# Patient Record
Sex: Female | Born: 1970 | Race: White | Hispanic: No | Marital: Married | Smoking: Never smoker
Health system: Southern US, Community
[De-identification: ages and names within clinical notes are randomized; demographics above are authoritative.]

## PROBLEM LIST (undated history)

## (undated) DIAGNOSIS — M1990 Unspecified osteoarthritis, unspecified site: Secondary | ICD-10-CM

## (undated) DIAGNOSIS — E049 Nontoxic goiter, unspecified: Secondary | ICD-10-CM

## (undated) DIAGNOSIS — R011 Cardiac murmur, unspecified: Secondary | ICD-10-CM

## (undated) DIAGNOSIS — B019 Varicella without complication: Secondary | ICD-10-CM

## (undated) DIAGNOSIS — B059 Measles without complication: Secondary | ICD-10-CM

## (undated) DIAGNOSIS — K589 Irritable bowel syndrome without diarrhea: Secondary | ICD-10-CM

## (undated) HISTORY — PX: WISDOM TOOTH EXTRACTION: SHX21

## (undated) HISTORY — DX: Measles without complication: B05.9

## (undated) HISTORY — DX: Varicella without complication: B01.9

---

## 1997-06-12 ENCOUNTER — Emergency Department (HOSPITAL_COMMUNITY): Admission: EM | Admit: 1997-06-12 | Discharge: 1997-06-12 | Payer: Self-pay | Admitting: Emergency Medicine

## 1997-08-10 ENCOUNTER — Other Ambulatory Visit: Admission: RE | Admit: 1997-08-10 | Discharge: 1997-08-10 | Payer: Self-pay | Admitting: Obstetrics and Gynecology

## 1998-11-14 ENCOUNTER — Inpatient Hospital Stay (HOSPITAL_COMMUNITY): Admission: AD | Admit: 1998-11-14 | Discharge: 1998-11-14 | Payer: Self-pay | Admitting: Obstetrics and Gynecology

## 1999-01-06 ENCOUNTER — Inpatient Hospital Stay (HOSPITAL_COMMUNITY): Admission: AD | Admit: 1999-01-06 | Discharge: 1999-01-06 | Payer: Self-pay | Admitting: Obstetrics and Gynecology

## 1999-01-24 ENCOUNTER — Emergency Department (HOSPITAL_COMMUNITY): Admission: EM | Admit: 1999-01-24 | Discharge: 1999-01-24 | Payer: Self-pay | Admitting: Emergency Medicine

## 1999-01-24 ENCOUNTER — Encounter: Payer: Self-pay | Admitting: Emergency Medicine

## 1999-01-28 ENCOUNTER — Inpatient Hospital Stay (HOSPITAL_COMMUNITY): Admission: AD | Admit: 1999-01-28 | Discharge: 1999-01-31 | Payer: Self-pay | Admitting: Obstetrics and Gynecology

## 1999-02-01 ENCOUNTER — Encounter: Admission: RE | Admit: 1999-02-01 | Discharge: 1999-05-02 | Payer: Self-pay | Admitting: Obstetrics and Gynecology

## 1999-03-01 ENCOUNTER — Other Ambulatory Visit: Admission: RE | Admit: 1999-03-01 | Discharge: 1999-03-01 | Payer: Self-pay | Admitting: Obstetrics and Gynecology

## 1999-08-28 ENCOUNTER — Emergency Department (HOSPITAL_COMMUNITY): Admission: EM | Admit: 1999-08-28 | Discharge: 1999-08-28 | Payer: Self-pay | Admitting: Emergency Medicine

## 2001-09-01 ENCOUNTER — Encounter: Payer: Self-pay | Admitting: Emergency Medicine

## 2001-09-01 ENCOUNTER — Emergency Department (HOSPITAL_COMMUNITY): Admission: EM | Admit: 2001-09-01 | Discharge: 2001-09-01 | Payer: Self-pay | Admitting: Emergency Medicine

## 2001-09-05 ENCOUNTER — Encounter: Payer: Self-pay | Admitting: Gastroenterology

## 2001-09-05 ENCOUNTER — Ambulatory Visit (HOSPITAL_COMMUNITY): Admission: RE | Admit: 2001-09-05 | Discharge: 2001-09-05 | Payer: Self-pay | Admitting: Gastroenterology

## 2001-09-11 ENCOUNTER — Other Ambulatory Visit: Admission: RE | Admit: 2001-09-11 | Discharge: 2001-09-11 | Payer: Self-pay | Admitting: Obstetrics and Gynecology

## 2002-02-12 ENCOUNTER — Emergency Department (HOSPITAL_COMMUNITY): Admission: EM | Admit: 2002-02-12 | Discharge: 2002-02-12 | Payer: Self-pay | Admitting: Emergency Medicine

## 2003-03-04 ENCOUNTER — Other Ambulatory Visit: Admission: RE | Admit: 2003-03-04 | Discharge: 2003-03-04 | Payer: Self-pay | Admitting: Obstetrics and Gynecology

## 2003-06-10 ENCOUNTER — Ambulatory Visit (HOSPITAL_COMMUNITY): Admission: RE | Admit: 2003-06-10 | Discharge: 2003-06-10 | Payer: Self-pay | Admitting: Obstetrics and Gynecology

## 2003-09-18 ENCOUNTER — Inpatient Hospital Stay (HOSPITAL_COMMUNITY): Admission: AD | Admit: 2003-09-18 | Discharge: 2003-09-18 | Payer: Self-pay | Admitting: Obstetrics and Gynecology

## 2004-11-17 ENCOUNTER — Ambulatory Visit: Payer: Self-pay | Admitting: Family Medicine

## 2005-01-04 ENCOUNTER — Ambulatory Visit: Payer: Self-pay | Admitting: Family Medicine

## 2005-01-30 ENCOUNTER — Other Ambulatory Visit: Admission: RE | Admit: 2005-01-30 | Discharge: 2005-01-30 | Payer: Self-pay | Admitting: Obstetrics and Gynecology

## 2005-02-13 ENCOUNTER — Ambulatory Visit: Payer: Self-pay | Admitting: Family Medicine

## 2006-06-09 ENCOUNTER — Observation Stay (HOSPITAL_COMMUNITY): Admission: EM | Admit: 2006-06-09 | Discharge: 2006-06-10 | Payer: Self-pay | Admitting: Emergency Medicine

## 2006-06-29 ENCOUNTER — Inpatient Hospital Stay (HOSPITAL_COMMUNITY): Admission: EM | Admit: 2006-06-29 | Discharge: 2006-07-02 | Payer: Self-pay | Admitting: Emergency Medicine

## 2006-09-12 ENCOUNTER — Ambulatory Visit: Payer: Self-pay | Admitting: Family Medicine

## 2006-09-13 ENCOUNTER — Ambulatory Visit (HOSPITAL_BASED_OUTPATIENT_CLINIC_OR_DEPARTMENT_OTHER): Admission: RE | Admit: 2006-09-13 | Discharge: 2006-09-13 | Payer: Self-pay | Admitting: General Surgery

## 2007-09-12 ENCOUNTER — Ambulatory Visit: Payer: Self-pay | Admitting: Family Medicine

## 2007-09-12 DIAGNOSIS — J309 Allergic rhinitis, unspecified: Secondary | ICD-10-CM | POA: Insufficient documentation

## 2007-09-12 DIAGNOSIS — J45909 Unspecified asthma, uncomplicated: Secondary | ICD-10-CM | POA: Insufficient documentation

## 2007-09-20 ENCOUNTER — Ambulatory Visit: Payer: Self-pay | Admitting: Family Medicine

## 2007-09-20 DIAGNOSIS — L299 Pruritus, unspecified: Secondary | ICD-10-CM | POA: Insufficient documentation

## 2007-09-22 ENCOUNTER — Telehealth: Payer: Self-pay | Admitting: Internal Medicine

## 2009-03-24 ENCOUNTER — Emergency Department (HOSPITAL_COMMUNITY): Admission: EM | Admit: 2009-03-24 | Discharge: 2009-03-25 | Payer: Self-pay | Admitting: Emergency Medicine

## 2009-03-28 ENCOUNTER — Ambulatory Visit: Payer: Self-pay | Admitting: Family Medicine

## 2009-03-28 DIAGNOSIS — K589 Irritable bowel syndrome without diarrhea: Secondary | ICD-10-CM | POA: Insufficient documentation

## 2009-05-25 ENCOUNTER — Encounter: Payer: Self-pay | Admitting: Family Medicine

## 2010-03-28 NOTE — Letter (Signed)
Summary: Referral - not able to see patient  [facility]  [address] [place], [place] [address]   Phone: [phone]  Fax: [phone]    May 25, 2009    Kelle Darting, M.D. [address] [place], [place] [address]    Re:   Kendra Robbins DOB:  [birth year] MRN:   [id number]    Dear Dr. Tawanna Cooler:  Thank you for your kind referral of the above patient.  We have attempted to schedule the Office Visit but have not been able to schedule because:  ___ The patient was not available by phone and/or has not returned our calls.   X  The patient declined to schedule the procedure at this time.  We appreciate the referral and hope that we will have the opportunity to treat this patient in the future.    Sincerely,    [facility] [phone]

## 2010-03-28 NOTE — Assessment & Plan Note (Signed)
Summary: nausea/dizzy/body aches/diarrhea/cjr   Vital Signs:  Patient profile:   40 year old female Height:      61 inches Weight:      218 pounds BMI:     41.34 Temp:     99.2 degrees F oral BP sitting:   120 / 84  (left arm) Cuff size:   regular  Vitals Entered By: Kern Reap CMA Duncan Dull) (March 28, 2009 12:06 PM)  Reason for Visit nausea and diarrhea, light headed  History of Present Illness: Kendra Robbins is a delightful 40 year old, married female, nonsmoker, who comes in today with a complex history.  She states for the past two to 3, months.  She's had a left low back pain, along with crampy abdominal pain in 3 to 4 bowel movements loose per day.  She was told at one time the she might have IBS.  This was many years ago, but one of our GI folks.  She does not recall who she saw the time.  She's also complaining of neck pain and soreness in the muscles of her legs cramps all over.  She said 10 pounds of weight loss to 218.  LMP was 6 weeks ago.  Her husband had a vasectomy.  She recently because of the severe neck pain and back pain went to the emergency room despite having no vomiting, and diarrhea.  She was told she was dehydrated, which she was not her CBC, electrolytes, urine pregnancy test.  All were normal.  Nonfasting blood sugar was 122.  Last Pap two years ago, normal  Allergies: 1)  ! * Terazol 2)  ! * Tetanus 3)  ! * Dilaudid 4)  ! Flagyl 5)  ! Percocet 6)  ! Prednisone 7)  ! * Unasyn 8)  ! * Zosyn  Past History:  Past medical, surgical, family and social histories (including risk factors) reviewed for relevance to current acute and chronic problems.  Past Medical History: Reviewed history from 09/12/2007 and no changes required. Bronchitis Obesity Allergic rhinitis Asthma  Past Surgical History: Reviewed history from 10/30/2006 and no changes required. Colonoscopy Wisdom Teeth  Family History: Reviewed history from 10/30/2006 and no changes  required. Family History of Asthma  Social History: Reviewed history from 10/30/2006 and no changes required. Occupation: Married Never Smoked Alcohol use-no Drug use-no  Review of Systems      See HPI  Physical Exam  General:  Well-developed,well-nourished,in no acute distress; alert,appropriate and cooperative throughout examination Neck:  No deformities, masses, or tenderness noted. Lungs:  Normal respiratory effort, chest expands symmetrically. Lungs are clear to auscultation, no crackles or wheezes. Heart:  Normal rate and regular rhythm. S1 and S2 normal without gallop, murmur, click, rub or other extra sounds. Abdomen:  the abdomen is markedly enlarged.  There is some tenderness in left upper quadrant.  No rebound.  No masses Msk:  No deformity or scoliosis noted of thoracic or lumbar spine.   Pulses:  R and L carotid,radial,femoral,dorsalis pedis and posterior tibial pulses are full and equal bilaterally Extremities:  No clubbing, cyanosis, edema, or deformity noted with normal full range of motion of all joints.   Neurologic:  No cranial nerve deficits noted. Station and gait are normal. Plantar reflexes are down-going bilaterally. DTRs are symmetrical throughout. Sensory, motor and coordinative functions appear intact. Skin:  Intact without suspicious lesions or rashes Psych:  Cognition and judgment appear intact. Alert and cooperative with normal attention span and concentration. No apparent delusions, illusions, hallucinations   Impression &  Recommendations:  Problem # 1:  IBS (ICD-564.1) Assessment Deteriorated  Orders: Gastroenterology Referral (GI)  Complete Medication List: 1)  Prednisone 20 Mg Tabs (Prednisone) .... Uad  Patient Instructions: 1)  avoid all foods containing lactose. 2)  Take Motrin 600 mg twice a day with food for your back and leg pain. 3)  We will get to set up for a GI consult.  If that is nondiagnostic then the next step would be a  rheumatology consult.

## 2010-05-15 LAB — URINALYSIS, ROUTINE W REFLEX MICROSCOPIC
Bilirubin Urine: NEGATIVE
Glucose, UA: NEGATIVE mg/dL
Hgb urine dipstick: NEGATIVE
Ketones, ur: 15 mg/dL — AB
Protein, ur: NEGATIVE mg/dL
Urobilinogen, UA: 0.2 mg/dL (ref 0.0–1.0)

## 2010-05-15 LAB — CBC
HCT: 44.2 % (ref 36.0–46.0)
Hemoglobin: 15.3 g/dL — ABNORMAL HIGH (ref 12.0–15.0)
MCHC: 34.7 g/dL (ref 30.0–36.0)
MCV: 89 fL (ref 78.0–100.0)
Platelets: 294 10*3/uL (ref 150–400)
RBC: 4.96 MIL/uL (ref 3.87–5.11)
RDW: 13.6 % (ref 11.5–15.5)
WBC: 6.7 10*3/uL (ref 4.0–10.5)

## 2010-05-15 LAB — DIFFERENTIAL
Basophils Absolute: 0 10*3/uL (ref 0.0–0.1)
Basophils Relative: 0 % (ref 0–1)
Eosinophils Absolute: 0 10*3/uL (ref 0.0–0.7)
Eosinophils Relative: 0 % (ref 0–5)
Lymphocytes Relative: 9 % — ABNORMAL LOW (ref 12–46)
Lymphs Abs: 0.6 10*3/uL — ABNORMAL LOW (ref 0.7–4.0)
Monocytes Absolute: 0 10*3/uL — ABNORMAL LOW (ref 0.1–1.0)
Monocytes Relative: 1 % — ABNORMAL LOW (ref 3–12)
Neutro Abs: 6.1 10*3/uL (ref 1.7–7.7)
Neutrophils Relative %: 91 % — ABNORMAL HIGH (ref 43–77)

## 2010-05-15 LAB — POCT I-STAT, CHEM 8
Calcium, Ion: 1.19 mmol/L (ref 1.12–1.32)
Creatinine, Ser: 0.6 mg/dL (ref 0.4–1.2)
Glucose, Bld: 122 mg/dL — ABNORMAL HIGH (ref 70–99)
Hemoglobin: 15.6 g/dL — ABNORMAL HIGH (ref 12.0–15.0)
Sodium: 136 mEq/L (ref 135–145)
TCO2: 25 mmol/L (ref 0–100)

## 2010-05-15 LAB — PREGNANCY, URINE: Preg Test, Ur: NEGATIVE

## 2010-05-15 LAB — URINE MICROSCOPIC-ADD ON

## 2010-07-11 NOTE — Op Note (Signed)
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   POSTOPERATIVE DIAGNOSIS:  Fistula in ano.   PROCEDURE:  Repair of fistula in ano by placement of cutting seton (two  size 0-0 silk sutures).   SURGEON:  Adolph Pollack, M.D.   ANESTHESIA:  General.   INDICATIONS:  Ms. Avis is a 40 year old female who had a complex left  anorectal abscess that was drained.  It recurred requiring second  drainage and has not completely healed and continues to drain.  In the  office, I was able to detect a fistula in ano and now she presents for  the above procedure.   TECHNIQUE:  She was seen in the holding area, then brought to the  operating room, placed supine on the operating table and general  anesthetic was administered.  She was then placed in the lithotomy  position and the perianal area sterilely prepped and draped.  An  anoscope was placed.  I used a blunt probe and identified the open area  of the wound and in the left perianal area.  I passed a probe  submuscular and found the fistula in ano.  I did not feel that I could  do a fistulotomy and I felt I might sacrifice too much muscle and cause  her to be incontinent.  Subsequently, I took two size 0-0 silk sutures,  passed them through the tract and tied them down tightly in a cutting  seton type fashion.  I then anesthetized the area with Marcaine.  Hemostasis was adequate.  A bulky dressing was applied.  She was taken  to recovery room in satisfactory condition and there were no apparent  complications.      Adolph Pollack, M.D.  Electronically Signed     TJR/MEDQ  D:  09/13/2006   T:  09/14/2006  Job:  [id number]

## 2010-07-11 NOTE — Assessment & Plan Note (Signed)
[facility]                                 ON-CALL NOTE   NAME:NORRISNayla, Kendra Robbins                        MRN:          [id number]  DATE:09/20/2007                            DOB:          13-May-1970    TIME SEEN:  8:52 a.m.   PHONE NUMBER:  [phone].   PATIENT OF:  [name]. Tawanna Cooler, MD.   She had a question about medicines and a skin problem.  The patient  stated last Friday, Dr. Tawanna Cooler gave her some prednisone taper for  allergies and asthma.  She did well with it until she got down to the  point where she was supposed to take a half pill each day for 3 days, it  sounds like it was 20 mg.  She missed her half dose yesterday.  Now, she  has some redness of her skin in her face and neck.  She stated that it  feels like she had a sunburn, although she has not really been out in  the sun.  She denies hives.  She stated she is very itchy around her  neck.  She was taking Zyrtec.  She took an extra dose of Benadryl on top  of it.  It does not seem to be helping.  She denies fever, chills, or  sinus pain.  She stated her ears are ringing a little, and her sinuses  are congested, but that is normal for her.  She is having no difficulty  breathing, no wheezing, and overall is feeling better.  She does not  know what is going on and wants to know if she needs to restart her  prednisone at the half dose.  I have told her to come on in for  evaluation at the [facility] clinic, so I can take a look at her rash.  At  that point, she also added she has been around cats and that might be  the reason for her rash as well.  I still offer her an appointment, and  she is coming in to be evaluated this morning.     Marne A. Tower, MD  Electronically Signed    MAT/MedQ  DD: 09/20/2007  DT: 09/20/2007  Job #: [id number]

## 2010-07-14 NOTE — Op Note (Signed)
Kendra Robbins, Kendra Robbins               ACCOUNT NO.:  [id number]   MEDICAL RECORD NO.:  [id number]          PATIENT TYPE:  OBV   LOCATION:  1829                         FACILITY:  [facility]   PHYSICIAN:  Adolph Pollack, M.D.DATE OF BIRTH:  [birth year]   DATE OF PROCEDURE:  06/09/2006  DATE OF DISCHARGE:                               OPERATIVE REPORT   PREOPERATIVE DIAGNOSIS:  Left anorectal abscess.   POSTOPERATIVE DIAGNOSIS:  Left anorectal abscess.   PROCEDURE:  Complex incision and drainage of left anorectal abscess.   SURGEON:  Adolph Pollack, M.D.   ANESTHESIA:  2% lidocaine.   TECHNIQUE:  She was placed in a lithotomy position in the emergency  department bed.  The left perianal area was sterilely prepped.  The area  was anesthetized with 2% lidocaine.  I then made an elliptical incision  in the skin in the left anorectal region; and evacuated a large amount  of purulent material under pressure.  Cavity measured approximately 6  cm.  I then placed a 1/4-inch Penrose drain into the cavity and anchored  it to the skin with a 4-0 chromic suture.  A bulky dressing was applied.  She tolerated the procedure well.      Adolph Pollack, M.D.  Electronically Signed     TJR/MEDQ  D:  06/09/2006  T:  06/09/2006  Job:  [id number]

## 2010-07-14 NOTE — Op Note (Signed)
NAMEJAYLEAN, Kendra Robbins               ACCOUNT NO.:  [id number]   MEDICAL RECORD NO.:  [id number]          PATIENT TYPE:  INP   LOCATION:  5706                         FACILITY:  [facility]   PHYSICIAN:  Cherylynn Ridges, M.D.    DATE OF BIRTH:  April 23, 1970   DATE OF PROCEDURE:  06/29/2006  DATE OF DISCHARGE:                               OPERATIVE REPORT   PREOPERATIVE DIAGNOSIS:  Recurrent perirectal and perineal abscess on  the left side.   POSTOPERATIVE DIAGNOSIS:  Recurrent perirectal and perineal abscess on  the left side.   PROCEDURE:  Incision and drainage of recurrent perineal and perirectal  abscess on the left side.   SURGEON:  Marta Lamas. Lindie Spruce, M.D.   ANESTHESIA:  General with a laryngeal airway.   ESTIMATED BLOOD LOSS:  50 mL.   COMPLICATIONS:  None.   CONDITION:  Stable.   FINDINGS:  The patient had a large golf ball or larger sized abscess in  the perianal area extending deep into the gluteal fatty tissue and also  towards the vulva.  There were no foreign bodies noted.   INDICATIONS FOR OPERATION:  The patient is a 40 year old female who had  a previous I&D of a perirectal abscess about three weeks ago who comes  in now with recurrence.   OPERATION:  The patient was taken to the operating room and placed on  the table in supine position.  After an adequate laryngeal airway  anesthetic was administered, she was prepped and draped in the usual  sterile manner after being placed in lithotomy position.   We made an incision about 5.5 cm long and bluntly dissected down into a  scarred and purulent cavity.  Aerobic and anaerobic cultures were sent.  There was some bleeding from the cavity.  We broke up loculations both  superiorly, anteriorly, and posteriorly.  After irrigating the wound  with an entire bottle of saline solution, we packed the entire cavity  with 0.5-inch iodoform NuGauze.  This will be removed prior to  discharge.   Once it was packed, it was covered  with 4 x 4s and ABD, and the patient  was taken to the recovery room in stable condition.      Cherylynn Ridges, M.D.  Electronically Signed     JOW/MEDQ  D:  06/29/2006  T:  06/30/2006  Job:  [id number]

## 2010-07-14 NOTE — H&P (Signed)
NAMEHAGEN, TIDD               ACCOUNT NO.:  [id number]   MEDICAL RECORD NO.:  [id number]           PATIENT TYPE:   LOCATION:                                 FACILITY:   PHYSICIAN:  Adolph Pollack, M.D.    DATE OF BIRTH:   DATE OF ADMISSION:  DATE OF DISCHARGE:                              HISTORY & PHYSICAL   CHIEF COMPLAINT:  Pain and rectal area.   HISTORY OF PRESENT ILLNESS:  This is a 40 year old female who about a  week or so ago had a little bit of swelling in the perianal area that  she said burst like a blood blister.  However, she began to progress to  be developing increasing perianal pain and having swelling and presented  to the emergency department last night.  She was evaluated and actually  underwent a CT of the pelvis which showed a 6 cm perianal abscess.  At  this time, we were called to see her.  She was given some Dilaudid in  the emergency department.  After the second dose, she had significant  nausea and vomiting following this.   PAST MEDICAL HISTORY:  1. Irritable bowel syndrome.  2. Stress urinary incontinence.  3. childhood pneumonia.   PREVIOUS OPERATIONS:  Wisdom tooth extraction.   ALLERGIES:  TETANUS.   MEDICATIONS:  None chronically.   SOCIAL HISTORY:  He is married with two children.  No tobacco or alcohol  use.   FAMILY HISTORY:  Atrial fibrillation in mother.   REVIEW OF SYSTEMS:  CARDIOVASCULAR:  No hypertension, heart disease.  PULMONARY:  No asthma, tuberculosis.  GI: No peptic ulcer disease,  hepatitis.  GU: No kidney stones.  ENDOCRINE:  No diabetes or  hypercholesterolemia.  NEUROLOGIC:  No seizures.  HEMATOLOGIC:  No  bleeding disorder, blood clots.   PHYSICAL EXAMINATION:  GENERAL:  An obese female who appears ill.  VITAL SIGNS:  Temperature is 99.9, blood pressure 121/84, pulse 101.  NECK:  Supple without obvious masses.  RESPIRATORY:  Breath sounds equal and clear, respirations unlabored.  CARDIOVASCULAR:  Heart  demonstrates a slightly increased rate with a  regular rhythm.  ABDOMEN:  Soft, obese, nontender, nondistended.  No obvious mass.  GU/ANORECTAL:  There is a left anorectal fluctuance, induration and  erythema present.  Slightly tender to touch.  EXTREMITIES:  Good muscle tone and range of motion.  SKIN:  No jaundice.   LABORATORY DATA:  Notable for white blood cell count of 10,300 with a  left shift.   CT scan was reviewed.   IMPRESSION:  1. Anorectal abscess.  2. Persistent nausea and vomiting after intravenous Dilaudid.   PLAN:  Will perform an incision and drainage here in emergency  department.  I will then admit her for 23-hour observation.      Adolph Pollack, M.D.  Electronically Signed     TJR/MEDQ  D:  06/09/2006  T:  06/09/2006  Job:  [id number]

## 2010-12-11 LAB — POCT HEMOGLOBIN-HEMACUE
Hemoglobin: 13.7
Operator id: 128471

## 2011-06-07 ENCOUNTER — Emergency Department (HOSPITAL_COMMUNITY)
Admission: EM | Admit: 2011-06-07 | Discharge: 2011-06-07 | Disposition: A | Discharge status: Home / Self Care | Payer: Medicaid Other | Attending: Emergency Medicine | Admitting: Emergency Medicine

## 2011-06-07 ENCOUNTER — Emergency Department (HOSPITAL_COMMUNITY): Payer: Medicaid Other

## 2011-06-07 ENCOUNTER — Encounter (HOSPITAL_COMMUNITY): Payer: Self-pay | Admitting: Nurse Practitioner

## 2011-06-07 DIAGNOSIS — W010XXA Fall on same level from slipping, tripping and stumbling without subsequent striking against object, initial encounter: Secondary | ICD-10-CM | POA: Insufficient documentation

## 2011-06-07 DIAGNOSIS — M79609 Pain in unspecified limb: Secondary | ICD-10-CM | POA: Insufficient documentation

## 2011-06-07 DIAGNOSIS — S5010XA Contusion of unspecified forearm, initial encounter: Secondary | ICD-10-CM | POA: Insufficient documentation

## 2011-06-07 DIAGNOSIS — S5011XA Contusion of right forearm, initial encounter: Secondary | ICD-10-CM

## 2011-06-07 HISTORY — DX: Cardiac murmur, unspecified: R01.1

## 2011-06-07 HISTORY — DX: Irritable bowel syndrome, unspecified: K58.9

## 2011-06-07 HISTORY — DX: Nontoxic goiter, unspecified: E04.9

## 2011-06-07 HISTORY — DX: Unspecified osteoarthritis, unspecified site: M19.90

## 2011-06-07 NOTE — ED Notes (Signed)
Returned from xray

## 2011-06-07 NOTE — ED Notes (Signed)
Pt fell yesterday hitting her R arm, was seen at [facility] and ace wrap applied but refused xrays due to cost. Reports she woke this am with increased pain, bruising and swelling to RFA. CMS intact. Denies other injuries
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
## 2011-06-07 NOTE — Discharge Instructions (Signed)
Ice and elevate arm when possible.  Take out of sling at least twice a day and do gentle range of motion exercises with shoulder.  Take tylenol as needed for pain, up to 4000mg  a day.  Follow up with your primary care doctor if pain has not started to improve in 7 days.  You should return to the ER if your pain or swelling worsens.

## 2011-06-07 NOTE — ED Notes (Signed)
Ortho called for pt  

## 2011-06-07 NOTE — ED Notes (Signed)
Patient transported to X-ray 
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
## 2011-06-07 NOTE — Progress Notes (Signed)
Orthopedic Tech Progress Note Patient Details:  Kendra Robbins [birth year] [id number]  Other Ortho Devices Ortho Device Location: arm sling Ortho Device Interventions: Application   Cammer, Mickie Bail 06/07/2011, 3:17 PM

## 2011-06-09 NOTE — ED Provider Notes (Signed)
Medical screening examination/treatment/procedure(s) were performed by non-physician practitioner and as supervising physician I was immediately available for consultation/collaboration.    Adalie Mand L Wilferd Ritson, MD 06/09/11 1135 

## 2013-07-14 ENCOUNTER — Encounter (INDEPENDENT_AMBULATORY_CARE_PROVIDER_SITE_OTHER): Payer: Self-pay | Admitting: Surgery

## 2013-07-14 ENCOUNTER — Ambulatory Visit (INDEPENDENT_AMBULATORY_CARE_PROVIDER_SITE_OTHER): Payer: Medicaid Other | Admitting: Surgery

## 2013-07-14 VITALS — BP 124/80 | HR 75 | Temp 97.5°F | Ht 61.0 in | Wt 256.0 lb

## 2013-07-14 DIAGNOSIS — K6289 Other specified diseases of anus and rectum: Secondary | ICD-10-CM | POA: Insufficient documentation

## 2013-07-14 NOTE — Patient Instructions (Signed)
Take antibiotics for the next 48 hours.  If no better call office and you will need pelvic CT to evaluate proctalgia.

## 2013-07-15 NOTE — Progress Notes (Signed)
Patient ID: Kendra MatteMarsha L Robbins, female   DOB: 05-Jun-1970, 43 y.o.   MRN: [id number]  Chief Complaint  Patient presents with  . rectal abscess    HPI Kendra Robbins is a 43 y.o. female.  Pt sent at the request ofJeffrey Shawnie DapperA Todd, MD For 5 day hx of rectal pain.  Constant and nothing makes it worse or better.  No blood in stool or drainage.  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  No hx of Crohn's disease. No fever or chills.   HPI  Past Medical History  Diagnosis Date  . Irritable bowel syndrome   . Migraine   . Goiter   . Heart murmur   . Arthritis     History reviewed. No pertinent past surgical history.  History reviewed. No pertinent family history.  Social History History  Substance Use Topics  . Smoking status: Never Smoker   . Smokeless tobacco: Not on file  . Alcohol Use: No    Allergies  Allergen Reactions  . Hydromorphone Hcl Other (See Comments)    Reaction unknown  . Metronidazole Other (See Comments)    Reaction unknown  . Oxycodone-Acetaminophen Other (See Comments)    Reaction unknown  . Piperacillin Sod-Tazobactam So Other (See Comments)    Reaction unknown  . Prednisone Other (See Comments)    Reaction unknown  . Rapaflo [Silodosin] Other (See Comments)    Room spinning, nausea/vomiting, dizziness.  . Tetanus Toxoid Other (See Comments)    Reaction unknown    Current Outpatient Prescriptions  Medication Sig Dispense Refill  . meloxicam (MOBIC) 7.5 MG tablet Take 7.5 mg by mouth daily.      . solifenacin (VESICARE) 5 MG tablet Take 10 mg by mouth daily.       No current facility-administered medications for this visit.    Review of Systems Review of Systems  Constitutional: Negative for fever and fatigue.  HENT: Negative.   Respiratory: Negative.   Cardiovascular: Negative.   Gastrointestinal: Negative for abdominal pain.  Endocrine: Negative.   Musculoskeletal: Negative.   Skin: Negative.   Hematological: Negative.     Psychiatric/Behavioral: Negative.     Blood pressure 124/80, pulse 75, temperature 97.5 F (36.4 C), height 5\' 1"  (1.549 m), weight 256 lb (116.121 kg).  Physical Exam Physical Exam  Constitutional: She is oriented to person, place, and time. She appears well-developed and well-nourished.  Eyes: EOM are normal. Pupils are equal, round, and reactive to light.  Genitourinary:     Neurological: She is alert and oriented to person, place, and time.    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  History of fistula en ano.  Started on clindamycin due to multiple allergies by primary care.       Plan    If no better in 48 hours,  Recommend pelvic CT to further evaluate.  Exam does not reveal an abscess at this point.  Agree with ABX and to follow condition on that.  If all else negative,  Needs EUA. Discussed with patient and husband.        Saydee Zolman A. Joseph Johns 07/15/2013, 7:57 AM

## 2013-07-21 ENCOUNTER — Encounter (INDEPENDENT_AMBULATORY_CARE_PROVIDER_SITE_OTHER): Payer: Self-pay | Admitting: Surgery

## 2013-07-21 ENCOUNTER — Encounter (INDEPENDENT_AMBULATORY_CARE_PROVIDER_SITE_OTHER): Payer: Medicaid Other | Admitting: Surgery

## 2013-07-21 ENCOUNTER — Ambulatory Visit (INDEPENDENT_AMBULATORY_CARE_PROVIDER_SITE_OTHER): Payer: Medicaid Other | Admitting: Surgery

## 2013-07-21 VITALS — BP 122/76 | HR 97 | Temp 98.5°F | Ht 61.0 in | Wt 255.0 lb

## 2013-07-21 DIAGNOSIS — K6289 Other specified diseases of anus and rectum: Secondary | ICD-10-CM

## 2013-07-21 NOTE — Patient Instructions (Signed)
Will set up CT to better evaluate pelvic pain.

## 2013-07-21 NOTE — Progress Notes (Signed)
Subjective:     Patient ID: Kendra Robbins, female   DOB: September 19, 1970, 43 y.o.   MRN: [id number]  HPI  Patient returns for followup of her proctalgia. She has good days and bad days. Not much better or worse. She stopped her antibiotics. She was getting some mild diarrhea and has resolved. Review of Systems  Cardiovascular: Negative.   Gastrointestinal: Positive for diarrhea and rectal pain.       Objective:   Physical Exam  Constitutional: She is oriented to person, place, and time.  Genitourinary:     Musculoskeletal: Normal range of motion.  Neurological: She is alert and oriented to person, place, and time.  Skin: Skin is warm and dry.  Psychiatric: She has a normal mood and affect. Her behavior is normal. Judgment and thought content normal.       Assessment:     Proctalgia    Plan:     No evidence of infection. He has continued discomfort therefore recommend CT pelvis to exclude abscess. If negative, will refer to Dr. Maisie Fus for evaluation.

## 2013-07-24 ENCOUNTER — Ambulatory Visit
Admission: RE | Admit: 2013-07-24 | Discharge: 2013-07-24 | Disposition: A | Discharge status: Home / Self Care | Payer: Medicaid Other | Source: Ambulatory Visit | Attending: Surgery | Admitting: Surgery

## 2013-07-24 DIAGNOSIS — K6289 Other specified diseases of anus and rectum: Secondary | ICD-10-CM

## 2013-07-24 MED ORDER — IOHEXOL 300 MG/ML  SOLN
125.0000 mL | Freq: Once | INTRAMUSCULAR | Status: AC | PRN
  Administered 2013-07-24: 125 mL via INTRAVENOUS

## 2013-08-10 ENCOUNTER — Ambulatory Visit (INDEPENDENT_AMBULATORY_CARE_PROVIDER_SITE_OTHER): Payer: Medicaid Other | Admitting: General Surgery

## 2013-08-10 ENCOUNTER — Encounter (INDEPENDENT_AMBULATORY_CARE_PROVIDER_SITE_OTHER): Payer: Self-pay | Admitting: General Surgery

## 2013-08-10 VITALS — BP 130/80 | HR 95 | Temp 98.2°F | Resp 16 | Ht 61.0 in | Wt 259.6 lb

## 2013-08-10 DIAGNOSIS — K6289 Other specified diseases of anus and rectum: Secondary | ICD-10-CM

## 2013-08-10 NOTE — Patient Instructions (Signed)
Call the office if your problems persist or worsen.    Fiber Chart  You should 25-30g of fiber per day and drinking 8 glasses of water to help your bowels move regularly.  In the chart below you can look up how much fiber you are getting in an average day.  If you are not getting enough fiber, you should add a fiber supplement to your diet.  Examples of this include Metamucil, FiberCon and Citrucel.  These can be purchased at your local grocery store or pharmacy.      [email or web address]://www.[employer].edu/offices/health/nutritioncoach/AtoZ/handouts/Fiber.pdf

## 2013-08-10 NOTE — Progress Notes (Signed)
Chief Complaint  Patient presents with  . proctalgia    HISTORY: Kendra Robbins is a 43 y.o. female who presents to the office with anal pain.  Other symptoms include stinging pain occasionally.  This had been occurring for about a month.  she has tried a round of antibiotics in the past with no success.  Nothing makes the symptoms worse.   It is intermittent in nature.  her bowel habits are regular and her bowel movements are somewhat hard.  She does endorse some straining.  her fiber intake is minimal.  her last colonoscopy was over 10 yrs ago and normal per pt.   Past Medical History  Diagnosis Date  . Irritable bowel syndrome   . Migraine   . Goiter   . Heart murmur   . Arthritis      History reviewed. Abscess and fistulotomy performed.  H/o bladder sling surgery    Current Outpatient Prescriptions  Medication Sig Dispense Refill  . loratadine (CLARITIN) 5 MG chewable tablet Chew 5 mg by mouth daily.      . meloxicam (MOBIC) 7.5 MG tablet Take 7.5 mg by mouth daily.      . mometasone (NASONEX) 50 MCG/ACT nasal spray Place 2 sprays into the nose daily.      . solifenacin (VESICARE) 5 MG tablet Take 10 mg by mouth daily.       No current facility-administered medications for this visit.      Allergies  Allergen Reactions  . Hydromorphone Hcl Other (See Comments)    Reaction unknown  . Metronidazole Other (See Comments)    Reaction unknown  . Oxycodone-Acetaminophen Other (See Comments)    Reaction unknown  . Piperacillin Sod-Tazobactam So Other (See Comments)    Reaction unknown  . Prednisone Other (See Comments)    Reaction unknown  . Rapaflo [Silodosin] Other (See Comments)    Room spinning, nausea/vomiting, dizziness.  . Tetanus Toxoid Other (See Comments)    Reaction unknown      History reviewed. No pertinent family history.  History   Social History  . Marital Status: Married    Spouse Name: N/A    Number of Children: N/A  . Years of Education: N/A    Social History Main Topics  . Smoking status: Never Smoker   . Smokeless tobacco: None  . Alcohol Use: No  . Drug Use: No  . Sexual Activity: None   Other Topics Concern  . None   Social History Narrative  . None      REVIEW OF SYSTEMS - PERTINENT POSITIVES ONLY: Review of Systems - General ROS: negative for - chills, fever or weight loss Hematological and Lymphatic ROS: negative for - bleeding problems, blood clots or bruising Respiratory ROS: no cough, shortness of breath, or wheezing Cardiovascular ROS: no chest pain or dyspnea on exertion Gastrointestinal ROS: positive for - abdominal pain, blood in stools and constipation negative for - change in bowel habits, diarrhea or nausea/vomiting Genito-Urinary ROS: no dysuria, trouble voiding, or hematuria  EXAM: Filed Vitals:   08/10/13 1549  BP: 130/80  Pulse: 95  Temp: 98.2 F (36.8 C)  Resp: 16    General appearance: alert and cooperative Resp: clear to auscultation bilaterally Cardio: regular rate and rhythm GI: normal findings: soft, non-tender  Procedure: Anoscopy Surgeon: Maisie Fushomas Diagnosis: anal pain  Assistant: Felix AhmadiStaten After the risks and benefits were explained, verbal consent was obtained for above procedure  Anesthesia: none Findings: no fluctuant masses, no signs of fistula,  grade 1 internal hemorrhoids    ASSESSMENT AND PLAN: Kendra MatteMarsha L Robbins is a 43 y.o. female with anal pain.  On exam I see no signs of a recurrent fistula or abscess.  Her symptoms are very minimal at this time, so we have decided to watch this for now.  She will call the office if her symptoms persist or get worse.  I think if this occurs, we will order an MRI to look for an occult fistula.  I have recommended that she start a fiber supplement for her irregular bowel habits.      Vanita PandaAlicia C Gillie Crisci, MD Colon and Rectal Surgery / General Surgery [facility]      Visit Diagnoses: 1. Anal pain     Primary  Care Physician: Dema SeverinYORK,REGINA F, NP

## 2015-06-28 ENCOUNTER — Other Ambulatory Visit: Payer: Self-pay | Admitting: Physician Assistant

## 2015-06-28 DIAGNOSIS — IMO0001 Reserved for inherently not codable concepts without codable children: Secondary | ICD-10-CM

## 2015-06-28 DIAGNOSIS — H9313 Tinnitus, bilateral: Secondary | ICD-10-CM

## 2015-06-28 DIAGNOSIS — H918X1 Other specified hearing loss, right ear: Secondary | ICD-10-CM

## 2015-07-04 ENCOUNTER — Ambulatory Visit
Admission: RE | Admit: 2015-07-04 | Discharge: 2015-07-04 | Disposition: A | Discharge status: Home / Self Care | Payer: Medicaid Other | Source: Ambulatory Visit | Attending: Physician Assistant | Admitting: Physician Assistant

## 2015-07-04 DIAGNOSIS — H9313 Tinnitus, bilateral: Secondary | ICD-10-CM

## 2015-07-04 DIAGNOSIS — H918X1 Other specified hearing loss, right ear: Secondary | ICD-10-CM

## 2015-07-04 DIAGNOSIS — IMO0001 Reserved for inherently not codable concepts without codable children: Secondary | ICD-10-CM

## 2015-07-04 MED ORDER — GADOBENATE DIMEGLUMINE 529 MG/ML IV SOLN
20.0000 mL | Freq: Once | INTRAVENOUS | Status: AC | PRN
  Administered 2015-07-04: 20 mL via INTRAVENOUS

## 2016-03-19 ENCOUNTER — Encounter (HOSPITAL_COMMUNITY): Payer: Self-pay

## 2016-03-19 ENCOUNTER — Emergency Department (HOSPITAL_COMMUNITY): Payer: Medicaid Other

## 2016-03-19 ENCOUNTER — Emergency Department (HOSPITAL_COMMUNITY)
Admission: EM | Admit: 2016-03-19 | Discharge: 2016-03-20 | Disposition: A | Discharge status: Home / Self Care | Payer: Medicaid Other | Attending: Emergency Medicine | Admitting: Emergency Medicine

## 2016-03-19 DIAGNOSIS — Y999 Unspecified external cause status: Secondary | ICD-10-CM | POA: Insufficient documentation

## 2016-03-19 DIAGNOSIS — Y92410 Unspecified street and highway as the place of occurrence of the external cause: Secondary | ICD-10-CM | POA: Diagnosis not present

## 2016-03-19 DIAGNOSIS — Y939 Activity, unspecified: Secondary | ICD-10-CM | POA: Diagnosis not present

## 2016-03-19 DIAGNOSIS — M79644 Pain in right finger(s): Secondary | ICD-10-CM

## 2016-03-19 NOTE — ED Notes (Signed)
Patient transported to X-ray 
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
## 2016-03-20 MED ORDER — NAPROXEN 500 MG PO TABS: 500.0000 mg | ORAL_TABLET | Freq: Two times a day (BID) | ORAL | 0 refills | Status: DC

## 2016-03-20 MED ORDER — NAPROXEN 250 MG PO TABS
500.0000 mg | ORAL_TABLET | Freq: Once | ORAL | Status: AC
  Administered 2016-03-20: 500 mg via ORAL
  Filled 2016-03-20: qty 2
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
## 2016-03-20 NOTE — ED Notes (Signed)
Signature pad not working. 

## 2016-07-29 ENCOUNTER — Emergency Department (HOSPITAL_BASED_OUTPATIENT_CLINIC_OR_DEPARTMENT_OTHER)
Admit: 2016-07-29 | Discharge: 2016-07-29 | Disposition: A | Discharge status: Home / Self Care | Payer: Medicaid Other | Attending: Emergency Medicine | Admitting: Emergency Medicine

## 2016-07-29 ENCOUNTER — Emergency Department (HOSPITAL_COMMUNITY)
Admission: EM | Admit: 2016-07-29 | Discharge: 2016-07-29 | Disposition: A | Discharge status: Home / Self Care | Payer: Medicaid Other | Attending: Emergency Medicine | Admitting: Emergency Medicine

## 2016-07-29 ENCOUNTER — Encounter (HOSPITAL_COMMUNITY): Payer: Self-pay | Admitting: Emergency Medicine

## 2016-07-29 DIAGNOSIS — M79651 Pain in right thigh: Secondary | ICD-10-CM | POA: Insufficient documentation

## 2016-07-29 DIAGNOSIS — M79609 Pain in unspecified limb: Secondary | ICD-10-CM | POA: Diagnosis not present

## 2016-07-29 MED ORDER — IBUPROFEN 800 MG PO TABS: 800.0000 mg | ORAL_TABLET | Freq: Three times a day (TID) | ORAL | 0 refills | Status: AC | PRN

## 2016-07-29 NOTE — Progress Notes (Signed)
VASCULAR LAB PRELIMINARY  PRELIMINARY  PRELIMINARY  PRELIMINARY  Right lower extremity venous duplex completed.    Preliminary report:  There is no DVT or SVT noted in the right lower extremity.   Called report to [facility] Lawyer, PA  [facility], [facility]ANDACE, RVT 07/29/2016, 9:31 AM

## 2016-07-29 NOTE — ED Triage Notes (Signed)
Reports a knot on the outer right thigh that she noticed a few days.  Reports pain going from knot to inner thigh.  Could not visualize knott in triage.  Skin soft and pliable.

## 2016-07-29 NOTE — ED Provider Notes (Signed)
[facility] Provider Note   CSN: [id number] Arrival date & time: 07/29/16  0559     History   Chief Complaint Chief Complaint  Patient presents with  . reports knot on outer right thigh.    HPI MAYCE NOYES is a 46 y.o. female.  HPI Patient presents to the emergency department with a painful spot on the right outer thigh that she also states feels like has a knot.  The patient states that she noticed this area 2 days ago.  She states that she has been standing and walking a lot over the last week.  The patient states that she did not take any medications prior to arrival.  States nothing seems make the condition better, but palpation makes the pain worseThe patient denies chest pain, shortness of breath, headache,blurred vision, neck pain, fever, weakness, numbness, dizziness, , nausea, vomiting,  rash, back pain, dysuria, hematemesis, bloody stool, near syncope, or syncope Past Medical History:  Diagnosis Date  . Arthritis   . Goiter   . Heart murmur   . Irritable bowel syndrome   . Migraine     Patient Active Problem List   Diagnosis Date Noted  . 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 IBS 03/28/2009  . PRURITUS 09/20/2007  . ALLERGIC RHINITIS 09/12/2007  . ASTHMA 09/12/2007    History reviewed. No pertinent surgical history.  OB History    No data available       Home Medications    Prior to Admission medications   Medication Sig Start Date End Date Taking? Authorizing Provider  loratadine (CLARITIN) 5 MG chewable tablet Chew 5 mg by mouth daily.    [provider]  meloxicam (MOBIC) 7.5 MG tablet Take 7.5 mg by mouth daily.    [provider]  mometasone (NASONEX) 50 MCG/ACT nasal spray Place 2 sprays into the nose daily.    [provider]  naproxen (NAPROSYN) 500 MG tablet Take 1 tablet (500 mg total) by mouth 2 (two) times daily. 03/20/16   Audry Pili, PA-C  solifenacin (VESICARE) 5 MG tablet Take 10 mg by mouth daily.    [provider]    Family History No family history on file.  Social History Social History  Substance Use Topics  . Smoking status: Never Smoker  . Smokeless tobacco: Never Used  . Alcohol use No     Allergies   Hydromorphone hcl; Metronidazole; Oxycodone-acetaminophen; Piperacillin sod-tazobactam so; Prednisone; Rapaflo [silodosin]; and Tetanus toxoid   Review of Systems Review of Systems  All other systems negative except as documented in the HPI. All pertinent positives and negatives as reviewed in the HPI. Physical Exam Updated Vital Signs BP 140/79 (BP Location: Left Arm)   Pulse 84   Temp 97.6 F (36.4 C) (Oral)   Resp 16   Ht 5\' 1"  (1.549 m)   Wt 111.1 kg (245 lb)   SpO2 98%   BMI 46.29 kg/m   Physical Exam  Constitutional: She is oriented to person, place, and time. She appears well-developed and well-nourished. No distress.  HENT:  Head: Normocephalic and atraumatic.  Eyes: Pupils are equal, round, and reactive to light.  Pulmonary/Chest: Effort normal.  Musculoskeletal:       Legs: Neurological: She is alert and oriented to person, place, and time.  Skin: Skin is warm and dry.  Psychiatric: She has a normal mood and affect.  Nursing note and vitals reviewed.    ED Treatments / Results  Labs (all labs ordered are  listed, but only abnormal results are displayed) Labs Reviewed - No data to display  EKG  EKG Interpretation None       Radiology No results found.  Procedures Procedures (including critical care time)  Medications Ordered in ED Medications - No data to display   Initial Impression / Assessment and Plan / ED Course  I have reviewed the triage vital signs and the nursing notes.  Pertinent labs & imaging results that were available during my care of the patient were reviewed by me and considered in my medical decision making (see chart for details).     The patient has a negative DVT study.  There is also no superficial  veins that are showing any clots.  Patient be treated with anti-inflammatories, to return here as needed.  Patient agrees the plan and all questions were answered.  Told to use ice and heat over the area as well  Final Clinical Impressions(s) / ED Diagnoses   Final diagnoses:  None    New Prescriptions New Prescriptions   No medications on file     Charlestine NightLawyer, Hilja Kintzel, Cordelia Poche-C 07/29/16 [id number]    Loren RacerYelverton, David, MD 08/02/16 [phone]

## 2016-07-29 NOTE — Discharge Instructions (Signed)
Your ultrasound for blood clots was negative.  Follow-up with your primary doctor.  Use ice and heat over the area that is sore

## 2020-09-19 ENCOUNTER — Emergency Department (HOSPITAL_COMMUNITY): Payer: 59

## 2020-09-19 ENCOUNTER — Other Ambulatory Visit: Payer: Self-pay

## 2020-09-19 ENCOUNTER — Emergency Department (HOSPITAL_COMMUNITY)
Admission: EM | Admit: 2020-09-19 | Discharge: 2020-09-19 | Disposition: A | Discharge status: Home / Self Care | Payer: 59 | Attending: Emergency Medicine | Admitting: Emergency Medicine

## 2020-09-19 ENCOUNTER — Encounter (HOSPITAL_COMMUNITY): Payer: Self-pay | Admitting: Emergency Medicine

## 2020-09-19 DIAGNOSIS — R4781 Slurred speech: Secondary | ICD-10-CM

## 2020-09-19 DIAGNOSIS — Z79899 Other long term (current) drug therapy: Secondary | ICD-10-CM | POA: Insufficient documentation

## 2020-09-19 DIAGNOSIS — J45909 Unspecified asthma, uncomplicated: Secondary | ICD-10-CM | POA: Diagnosis not present

## 2020-09-19 DIAGNOSIS — G43109 Migraine with aura, not intractable, without status migrainosus: Secondary | ICD-10-CM

## 2020-09-19 DIAGNOSIS — I639 Cerebral infarction, unspecified: Secondary | ICD-10-CM | POA: Insufficient documentation

## 2020-09-19 LAB — DIFFERENTIAL
Abs Immature Granulocytes: 0.01 10*3/uL (ref 0.00–0.07)
Basophils Absolute: 0.1 10*3/uL (ref 0.0–0.1)
Basophils Relative: 1 %
Eosinophils Absolute: 0.1 10*3/uL (ref 0.0–0.5)
Eosinophils Relative: 2 %
Immature Granulocytes: 0 %
Lymphocytes Relative: 27 %
Lymphs Abs: 1.6 10*3/uL (ref 0.7–4.0)
Monocytes Absolute: 0.3 10*3/uL (ref 0.1–1.0)
Monocytes Relative: 6 %
Neutro Abs: 3.8 10*3/uL (ref 1.7–7.7)
Neutrophils Relative %: 64 %

## 2020-09-19 LAB — I-STAT CHEM 8, ED
BUN: 12 mg/dL (ref 6–20)
Calcium, Ion: 1.09 mmol/L — ABNORMAL LOW (ref 1.15–1.40)
Chloride: 105 mmol/L (ref 98–111)
Creatinine, Ser: 0.9 mg/dL (ref 0.44–1.00)
Glucose, Bld: 126 mg/dL — ABNORMAL HIGH (ref 70–99)
HCT: 41 % (ref 36.0–46.0)
Hemoglobin: 13.9 g/dL (ref 12.0–15.0)
Potassium: 3.9 mmol/L (ref 3.5–5.1)
Sodium: 141 mmol/L (ref 135–145)
TCO2: 26 mmol/L (ref 22–32)

## 2020-09-19 LAB — COMPREHENSIVE METABOLIC PANEL
ALT: 23 U/L (ref 0–44)
AST: 19 U/L (ref 15–41)
Albumin: 3.2 g/dL — ABNORMAL LOW (ref 3.5–5.0)
Alkaline Phosphatase: 55 U/L (ref 38–126)
Anion gap: 8 (ref 5–15)
BUN: 11 mg/dL (ref 6–20)
CO2: 26 mmol/L (ref 22–32)
Calcium: 8.8 mg/dL — ABNORMAL LOW (ref 8.9–10.3)
Chloride: 106 mmol/L (ref 98–111)
Creatinine, Ser: 0.94 mg/dL (ref 0.44–1.00)
GFR, Estimated: >60 mL/min (ref >60)
Glucose, Bld: 132 mg/dL — ABNORMAL HIGH (ref 70–99)
Potassium: 4 mmol/L (ref 3.5–5.1)
Sodium: 140 mmol/L (ref 135–145)
Total Bilirubin: 0.4 mg/dL (ref 0.3–1.2)
Total Protein: 5.9 g/dL — ABNORMAL LOW (ref 6.5–8.1)

## 2020-09-19 LAB — CBC
HCT: 43 % (ref 36.0–46.0)
Hemoglobin: 13.8 g/dL (ref 12.0–15.0)
MCH: 28.8 pg (ref 26.0–34.0)
MCHC: 32.1 g/dL (ref 30.0–36.0)
MCV: 89.8 fL (ref 80.0–100.0)
Platelets: 220 10*3/uL (ref 150–400)
RBC: 4.79 MIL/uL (ref 3.87–5.11)
RDW: 14 % (ref 11.5–15.5)
WBC: 5.8 10*3/uL (ref 4.0–10.5)
nRBC: 0 % (ref 0.0–0.2)

## 2020-09-19 LAB — CBG MONITORING, ED: Glucose-Capillary: 137 mg/dL — ABNORMAL HIGH (ref 70–99)

## 2020-09-19 LAB — I-STAT BETA HCG BLOOD, ED (MC, WL, AP ONLY): I-stat hCG, quantitative: <5 m[IU]/mL (ref <5)

## 2020-09-19 LAB — PROTIME-INR
INR: 1.1 (ref 0.8–1.2)
Prothrombin Time: 13.7 seconds (ref 11.4–15.2)

## 2020-09-19 LAB — APTT: aPTT: 34 seconds (ref 24–36)

## 2020-09-19 MED ORDER — SODIUM CHLORIDE 0.9% FLUSH
3.0000 mL | Freq: Once | INTRAVENOUS | Status: AC
  Administered 2020-09-19: 3 mL via INTRAVENOUS

## 2020-09-19 MED ORDER — LORAZEPAM 2 MG/ML IJ SOLN
1.0000 mg | Freq: Once | INTRAMUSCULAR | Status: AC
  Administered 2020-09-19: 1 mg via INTRAVENOUS
  Filled 2020-09-19: qty 1

## 2020-09-19 NOTE — ED Notes (Signed)
Pt in room watching tv. NAD Family at bedside. Updated pt on plan of care. Pt denies any needs at this time

## 2020-09-19 NOTE — ED Notes (Signed)
Pt remains in MRI at this time  

## 2020-09-19 NOTE — Code Documentation (Signed)
Stroke Response Nurse Documentation Code Documentation  Kendra Robbins is a 50 y.o. female arriving to [facility] ED via [facility] on 09/19/2020 with past medical hx of Headaches. Code stroke was activated by EMS after patient was noted to have a new onset of left sided numbness at 1210 with husband reporting stuttering speech. Patient has h of headache that has been going on for three weeks. Reports left sided head pressure.   Stroke team at the bedside on patient arrival. Labs drawn and patient cleared for CT by Dr. Jacqulyn Bath. Patient to CT with team. NIHSS 1, see documentation for details and code stroke times. Patient with left decreased sensation on exam. The following imaging was completed: CT.  Patient is not a candidate for tPA due to being too mild to treat.  Care/Plan: Patient remains in the tPA window until 1645 - q30 VS/mNIHSS until then. If patients symptoms worsen, notify neurology immediately.    Bedside handoff with ED RN Camryn.    Lucila Maine  Stroke Response RN

## 2020-09-19 NOTE — ED Notes (Signed)
Patient transported to MRI 

## 2020-09-19 NOTE — ED Triage Notes (Signed)
Pt BIB [facility] for CODE STROKE. Pt was at home on the phone with her mother, then noticed L sided numbness and pressure to her head. Pt has had a headache x3 weeks. Pt called her husband, who noticed she had new onset slurred speech and stutter. LKW 1150 this AM. Pt A/O x4.

## 2020-09-19 NOTE — Consult Note (Signed)
Neurology Consultation  Reason for Consult: Left-sided sensory deficits, left-sided pressure headache, slurred speech  Referring Physician: Dr. Rodena Medin  CC: Left-sided headache, slurred speech  History is obtained from: Patient, EMS, Chart review  HPI: Kendra Robbins is a 50 y.o. female with a medical history significant for morbid obesity, migraine headaches, irritable bowel syndrome, and arthritis who presented to the ED via EMS for evaluation of decreased sensation of the left hemibody, left pressure headache, tremor and slurred/stuttering speech. Per patient, she was on the telephone with her mother this morning when she began to experience a left--sided pressure headache with left facial numbness and tingling. She decided to call her husband when these symptoms started and her husband noted that her speech was slurred and stuttering; he activated EMS for further evaluation. EMS noted that the patient has complained of her left-sided pressure headache with photophobia for 3 weeks with multiple recent evaluations but that the speech disturbance and numbness are acute issues. She states that her maximal left-sided headache today has been an 8/10 with photophobia but on arrival to the hospital she rates her headache a 4/10 without ongoing photophobia.    LKW: Symptom onset acutely at 12:10 this afternoon per patient tpa given?: no, symptoms too mild to treat with presentation most consistent with complex migraine headache with or without a functional component of presentation.  IR Thrombectomy? No, presentation is not consistent with an LVO Modified Rankin Scale: 0-Completely asymptomatic and back to baseline post- stroke  ROS: A complete ROS was performed and is negative except as noted in the HPI.  Past Medical History:  Diagnosis Date   Arthritis    Goiter    Heart murmur    Irritable bowel syndrome    Migraine    No past surgical history on file.  No family history on  file.  Social History:   reports that she has never smoked. She has never used smokeless tobacco. She reports that she does not drink alcohol and does not use drugs.  Medications  Current Facility-Administered Medications:    sodium chloride flush (NS) 0.9 % injection 3 mL, 3 mL, Intravenous, Once, Caryl Pina, MD  Current Outpatient Medications:    ibuprofen (ADVIL,MOTRIN) 800 MG tablet, Take 1 tablet (800 mg total) by mouth every 8 (eight) hours as needed., Disp: 21 tablet, Rfl: 0   loratadine (CLARITIN) 5 MG chewable tablet, Chew 5 mg by mouth daily., Disp: , Rfl:    meloxicam (MOBIC) 7.5 MG tablet, Take 7.5 mg by mouth daily., Disp: , Rfl:    mometasone (NASONEX) 50 MCG/ACT nasal spray, Place 2 sprays into the nose daily., Disp: , Rfl:    naproxen (NAPROSYN) 500 MG tablet, Take 1 tablet (500 mg total) by mouth 2 (two) times daily., Disp: 30 tablet, Rfl: 0   solifenacin (VESICARE) 5 MG tablet, Take 10 mg by mouth daily., Disp: , Rfl:   Exam: Current vital signs: BP 139/76   Pulse 87   Temp 98.4 F (36.9 C) (Oral)   Resp (!) 31   Ht 5\' 1"  (1.549 m)   Wt 120.6 kg   SpO2 98%   BMI 50.24 kg/m  Vital signs in last 24 hours:    GENERAL: Awake, alert, anxious appearing patient, in no acute distress Psych: Affect is consistent with a giddy anxiety, remains cooperative with examination Head: Normocephalic and atraumatic without obvious abnormality EENT: Normal conjunctivae, no OP obstruction, dry mm LUNGS: Normal respiratory effort. Non-labored breathing CV: Regular rate on tele,  no pedal edema ABDOMEN: Soft, rounded, non-tender Ext: warm, well perfused, without obvious deformity  NEURO:  Mental Status: Awake, alert, and oriented to person, place, time, and situation.  Good attention is noted. Patient appears anxious throughout examination. Speech/Language: speech is intact without dysarthria.   Naming, repetition, fluency, and comprehension intact without aphasia No  neglect noted Cranial Nerves:  II: PERRL 4 mm/brisk. Visual fields full.  III, IV, VI: EOMI without nystagmus, ptosis, or gaze palsy V: Sensation is intact to light touch but asymmetrical to face with reported decreased sensation to light touch on the left cheek.   VII: Face is symmetric resting and smiling. VIII: Hearing is intact to voice IX, X: Palate elevation is symmetric. Phonation normal.  XI: Normal sternocleidomastoid and trapezius muscle strength XII: Tongue protrudes midline without fasciculations.   Motor: 5/5 strength is all muscle groups with intermittent giveway weakness of bilateral upper extremities on repeat examinations. No vertical drift noted.  Tone is normal. Bulk is normal.  Patient without tremor on immediate assessment at the bridge but with development of tremor in CT initially involving only the right upper extremity then progressing to bilateral upper extremities. Tremor with functional quality with a waxing and waning severity that is worse when attention is brought to it.  Sensation: Sensation to light touch is intact in all extremities with decreased sensation to light touch on the left hemibody compared to the right. Sensation to cool temperature is equal in bilateral upper and lower extremities. Sensory examination reports vary between examiners.  Coordination: FTN intact bilaterally. HKS intact bilaterally. No pronator drift.  DTRs: 2+ and symmetric biceps, 1+ and symmetric patellae Gait: Deferred  NIHSS: 1a Level of Conscious.: 0 1b LOC Questions: 0 1c LOC Commands: 0 2 Best Gaze: 0 3 Visual: 0 4 Facial Palsy: 0 5a Motor Arm - left: 0 5b Motor Arm - Right: 0 6a Motor Leg - Left: 0 6b Motor Leg - Right: 0 7 Limb Ataxia: 0 8 Sensory: 1 9 Best Language: 0 10 Dysarthria: 0 11 Extinct. and Inatten.: 0 TOTAL: 1  Labs I have reviewed labs in epic and the results pertinent to this consultation are: CBC    Component Value Date/Time   WBC 5.8  09/19/2020 1331   RBC 4.79 09/19/2020 1331   HGB 13.8 09/19/2020 1331   HCT 43.0 09/19/2020 1331   PLT 220 09/19/2020 1331   MCV 89.8 09/19/2020 1331   MCH 28.8 09/19/2020 1331   MCHC 32.1 09/19/2020 1331   RDW 14.0 09/19/2020 1331   LYMPHSABS 1.6 09/19/2020 1331   MONOABS 0.3 09/19/2020 1331   EOSABS 0.1 09/19/2020 1331   BASOSABS 0.1 09/19/2020 1331   CMP     Component Value Date/Time   NA 141 09/19/2020 1327   K 3.9 09/19/2020 1327   CL 105 09/19/2020 1327   GLUCOSE 126 (H) 09/19/2020 1327   BUN 12 09/19/2020 1327   CREATININE 0.90 09/19/2020 1327   Lipid Panel  No results found for: CHOL, TRIG, HDL, CHOLHDL, VLDL, LDLCALC, LDLDIRECT  No results found for: HGBA1C  Imaging I have reviewed the images obtained:  CT-scan of the brain 09/19/2020: 1. Normal head CT. 2. ASPECTS is 10  Assessment: 50 y.o. female who presented as a Code Stroke for evaluation of dysarthria, left-sided headache, and decreased sensation to the left hemibody. - Examination reveals patient with intermittent giveway weakness of bilateral upper extremities and varying sensory deficit with intermittent reports of decreased sensation to light touch on the  left face, upper, and lower extremities. Also, patient initially with no tremor who developed a tremor of the RUE then bilateral UE that increases with attention to the tremor. Patient was without dysarthria or stuttering speech throughout examination. Initial NIHSS of 1.  - CTH is without acute intracranial abnormality with an ASPECTS of 10. - Presentation is most consistent with a complex migraine headache. Ddx includes a low suspicion of acute infarct with or without a superimposed functional etiology of presentation in addition to patient anxiety.  - tPA was not given due to low suspicion for acute ischemia/infarct and her presentation was felt to have a functional quality with deficits too mild to treat. The risk of tPA administration was felt by the  neurology team to outweigh the benefits of receiving therapy.   Recommendations: - MRI brain without contrast, if acute finding, neurology will expand to full stroke work up - No further immediate neurology recommendations at this time   Pt seen by NP/Neuro and later by MD. Note/plan to be edited by MD as needed.  Lanae Boast, AGAC-NP [facility] Pager: [phone]  I have seen and examined the patient. I have formulated the assessment and recommendations. 50 y.o. female who presented as a Code Stroke for evaluation of dysarthria, left-sided headache, and decreased sensation to the left hemibody. Examination reveals patient with intermittent giveway weakness of bilateral upper extremities and varying sensory deficit with intermittent reports of decreased sensation to light touch on the left face, upper, and lower extremities. Also, patient initially with no tremor who developed a tremor of the RUE then bilateral UE that increases with attention to the tremor. MRI should be obtained to further evaluate. Electronically signed: Dr. Caryl Pina

## 2020-09-19 NOTE — ED Provider Notes (Signed)
[facility] EMERGENCY DEPARTMENT Provider Note   CSN: [id number] Arrival date & time: 09/19/20  1318  An emergency department physician performed an initial assessment on this suspected stroke patient at 1321.  History Chief Complaint  Patient presents with   Code Stroke    Kendra Robbins is a 50 y.o. female.  50 year old female with prior medical history as detailed below presents for evaluation.  She arrives by EMS as a code stroke.  Neurology team meets her at the door.  She complained of a mild persistent headache for the last 3 weeks.  She was at home on the phone with her mother and at 1210 this afternoon she noted vague left-sided facial numbness and mildly slurred speech with stuttering.  Last known well time of 1150 this a.m.  At the time of my evaluation the patient feels improved.  The history is provided by the patient and medical records.  Neurologic Problem This is a new problem. The current episode started 1 to 2 hours ago. The problem occurs rarely. The problem has been rapidly improving. Pertinent negatives include no chest pain and no abdominal pain. Nothing aggravates the symptoms. Nothing relieves the symptoms.      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 No pertinent surgical history.   OB History   No obstetric history on file.     No family history on file.  Social History   Tobacco Use   Smoking status: Never   Smokeless tobacco: Never  Substance Use Topics   Alcohol use: No   Drug use: No    Home Medications Prior to Admission medications   Medication Sig Start Date End Date Taking? Authorizing Provider  acetaminophen (TYLENOL) 500 MG tablet Take  500 mg by mouth every 6 (six) hours as needed for moderate pain.   Yes [provider]  loratadine (CLARITIN) 10 MG tablet Take 10 mg by mouth daily as needed for allergies.   Yes [provider]  ibuprofen (ADVIL,MOTRIN) 800 MG tablet Take 1 tablet (800 mg total) by mouth every 8 (eight) hours as needed. Patient not taking: Reported on 09/19/2020 07/29/16   Charlestine Night, PA-C  naproxen (NAPROSYN) 500 MG tablet Take 1 tablet (500 mg total) by mouth 2 (two) times daily. Patient not taking: Reported on 09/19/2020 03/20/16   Audry Pili, PA-C    Allergies    Hydromorphone hcl, Metronidazole, Oxycodone-acetaminophen, Piperacillin sod-tazobactam so, Prednisone, Rapaflo [silodosin], and Tetanus toxoid  Review of Systems   Review of Systems  Cardiovascular:  Negative for chest pain.  Gastrointestinal:  Negative for abdominal pain.  All other systems reviewed and are negative.  Physical Exam Updated Vital Signs BP 138/83   Pulse 84   Temp 98.4 F (36.9 C) (Oral)   Resp (!) 28   Ht 5\' 1"  (1.549 m)   Wt 120.6 kg   SpO2 97%   BMI 50.24 kg/m   Physical Exam Vitals and nursing note reviewed.  Constitutional:      General: She is not in acute distress.    Appearance: Normal appearance. She is well-developed.  HENT:     Head: Normocephalic  and atraumatic.  Eyes:     Conjunctiva/sclera: Conjunctivae normal.     Pupils: Pupils are equal, round, and reactive to light.  Cardiovascular:     Rate and Rhythm: Normal rate and regular rhythm.     Heart sounds: Normal heart sounds.  Pulmonary:     Effort: Pulmonary effort is normal. No respiratory distress.     Breath sounds: Normal breath sounds.  Abdominal:     General: There is no distension.     Palpations: Abdomen is soft.     Tenderness: There is no abdominal tenderness.  Musculoskeletal:        General: No deformity. Normal range of motion.     Cervical back: Normal range of motion and neck supple.  Skin:     General: Skin is warm and dry.  Neurological:     General: No focal deficit present.     Mental Status: She is alert and oriented to person, place, and time. Mental status is at baseline.     Cranial Nerves: No cranial nerve deficit.     Sensory: No sensory deficit.     Motor: No weakness.     Coordination: Coordination normal.     Comments: ANO x4  Normal speech  No facial droop  LVO screen negative  5 out of 5 strength in both upper and lower extremities    ED Results / Procedures / Treatments   Labs (all labs ordered are listed, but only abnormal results are displayed) Labs Reviewed  CBG MONITORING, ED - Abnormal; Notable for the following components:      Result Value   Glucose-Capillary 137 (*)    All other components within normal limits  I-STAT CHEM 8, ED - Abnormal; Notable for the following components:   Glucose, Bld 126 (*)    Calcium, Ion 1.09 (*)    All other components within normal limits  PROTIME-INR  APTT  CBC  DIFFERENTIAL  COMPREHENSIVE METABOLIC PANEL  CBG MONITORING, ED  I-STAT BETA HCG BLOOD, ED ([facility], [facility], [facility] ONLY)    EKG EKG Interpretation  Date/Time:  Monday September 19 2020 13:41:59 EDT Ventricular Rate:  89 PR Interval:  120 QRS Duration: 92 QT Interval:  368 QTC Calculation: 448 R Axis:   77 Text Interpretation: Sinus rhythm Low voltage, precordial leads Confirmed by Kristine Royal [phone]) on 09/19/2020 1:48:48 PM  Radiology CT HEAD CODE STROKE WO CONTRAST  Result Date: 09/19/2020 CLINICAL DATA:  Code stroke. Left-sided weakness. Code stroke presentation. EXAM: CT HEAD WITHOUT CONTRAST TECHNIQUE: Contiguous axial images were obtained from the base of the skull through the vertex without intravenous contrast. COMPARISON:  MRI 07/04/2015 FINDINGS: Brain: The brain has normal appearance without evidence of atrophy, old or acute infarction, mass lesion, hemorrhage, hydrocephalus or extra-axial collection. Vascular: No abnormal vascular finding.  Skull: Normal Sinuses/Orbits: Clear/normal Other: None ASPECTS (Alberta Stroke Program Early CT Score) - Ganglionic level infarction (caudate, lentiform nuclei, internal capsule, insula, M1-M3 cortex): 7 - Supraganglionic infarction (M4-M6 cortex): 3 Total score (0-10 with 10 being normal): 10 IMPRESSION: 1. Normal head CT. 2. ASPECTS is 10 3. These results were communicated to Dr. Otelia Limes at 1:37 pm on 09/19/2020 by text page via the [facility] messaging system. Electronically Signed   By: Paulina Fusi M.D.   On: 09/19/2020 13:38    Procedures Procedures   Medications Ordered in ED Medications  LORazepam (ATIVAN) injection 1 mg (has no administration in time range)  sodium chloride flush (NS) 0.9 % injection 3  mL (3 mLs Intravenous Given 09/19/20 1350)    ED Course  I have reviewed the triage vital signs and the nursing notes.  Pertinent labs & imaging results that were available during my care of the patient were reviewed by me and considered in my medical decision making (see chart for details).    MDM Rules/Calculators/A&P                           MDM  MSE complete  Kendra Robbins was evaluated in Emergency Department on 09/19/2020 for the symptoms described in the history of present illness. She was evaluated in the context of the global COVID-19 pandemic, which necessitated consideration that the patient might be at risk for infection with the SARS-CoV-2 virus that causes COVID-19. Institutional protocols and algorithms that pertain to the evaluation of patients at risk for COVID-19 are in a state of rapid change based on information released by regulatory bodies including the CDC and federal and state organizations. These policies and algorithms were followed during the patient's care in the ED.  Patient presented with acute onset slurred speech and stuttering.  Patient was seen as a code stroke by neurology on arrival.  Patient is not a candidate for tPA given the rapidly improving and  minor symptoms.  CT imaging of the brain is without significant acute abnormality.  Per Dr. Otelia Limes, MRI - non contrast - of the brain would be beneficial. If MRI is without acute pathology and patient remains asymptomatic then DC home with follow-up would be appropriate.   Case and disposition signed out to Dr. Wilkie Aye.  Final Clinical Impression(s) / ED Diagnoses Final diagnoses:  Slurred speech    Rx / DC Orders ED Discharge Orders     None        Wynetta Fines, MD 09/22/20 [phone]

## 2020-09-19 NOTE — Discharge Instructions (Addendum)
You have been seen and discharged from the emergency department.  Your CT and MRI of the brain showed no evidence of stroke, bleeding, mass or other emergent findings.  Follow-up with neurology for reevaluation and further care. Take home medications as prescribed. If you have any worsening symptoms or further concerns for your health please return to an emergency department for further evaluation.

## 2020-09-20 NOTE — ED Provider Notes (Signed)
Patient signed out to me by previous provider, please refer to their note for full HPI.  Briefly this is a 49 year old female who presented to the emergency department as a code stroke for concern of slurred speech and stuttering.  Seen by neurology, code stroke work-up ensued.  Patient was not a tPA candidate, symptoms were minor and neuro exam is reassuring on arrival.  CT imaging of the brain did not show any significant abnormality.  Neurology has requested an MRI with noncontrast.  If this does not show any acute pathology and the patient remains symptomatic the plan would be for discharge home. Physical Exam  BP 135/79   Pulse 72   Temp 98.1 F (36.7 C) (Oral)   Resp (!) 21   Ht 5\' 1"  (1.549 m)   Wt 120.6 kg   SpO2 98%   BMI 50.24 kg/m   Physical Exam Vitals and nursing note reviewed.  Constitutional:      Appearance: Normal appearance.  HENT:     Head: Normocephalic.     Mouth/Throat:     Mouth: Mucous membranes are moist.  Eyes:     Pupils: Pupils are equal, round, and reactive to light.  Cardiovascular:     Rate and Rhythm: Normal rate.  Pulmonary:     Effort: Pulmonary effort is normal. No respiratory distress.  Abdominal:     Palpations: Abdomen is soft.     Tenderness: There is no abdominal tenderness.  Musculoskeletal:     Cervical back: No tenderness.  Skin:    General: Skin is warm.  Neurological:     Mental Status: She is alert and oriented to person, place, and time. Mental status is at baseline.     Motor: No weakness.     Comments: Symmetric face, clear fluent speech, follows commands  Psychiatric:        Mood and Affect: Mood normal.    ED Course/Procedures     Procedures  MDM   MRI shows no acute finding.  On my reevaluation patient speech is clear, fluent, she is comfortable.  No new complaints.  Plan for discharge and outpatient neurology follow-up.  Patient at this time appears safe and stable for discharge and will be treated as an outpatient.   Discharge plan and strict return to ED precautions discussed, patient verbalizes understanding and agreement.       , DO 09/20/20 1530

## 2020-09-21 ENCOUNTER — Ambulatory Visit (INDEPENDENT_AMBULATORY_CARE_PROVIDER_SITE_OTHER): Payer: 59 | Admitting: Diagnostic Neuroimaging

## 2020-09-21 ENCOUNTER — Encounter: Payer: Self-pay | Admitting: Diagnostic Neuroimaging

## 2020-09-21 VITALS — BP 124/80 | HR 87 | Ht 61.0 in | Wt 260.0 lb

## 2020-09-21 DIAGNOSIS — G43109 Migraine with aura, not intractable, without status migrainosus: Secondary | ICD-10-CM

## 2020-09-21 DIAGNOSIS — R0683 Snoring: Secondary | ICD-10-CM | POA: Diagnosis not present

## 2020-09-21 MED ORDER — TOPIRAMATE 50 MG PO TABS: 50.0000 mg | ORAL_TABLET | Freq: Two times a day (BID) | ORAL | 4 refills | Status: DC

## 2020-09-21 NOTE — Patient Instructions (Signed)
COMPLICATED MIGRAINE + migraine with aura  MIGRAINE PREVENTION  LIFESTYLE CHANGES -Stop or avoid smoking -Decrease or avoid caffeine / alcohol -Eat and sleep on a regular schedule -Exercise several times per week - start topiramate 50mg  at bedtime; after 1-2 weeks increase to 50mg  twice a day; drink plenty of water  MIGRAINE RESCUE  - ibuprofen, tylenol as needed - rizatriptan (Maxalt) 10mg  as needed for breakthrough headache; may repeat x 1 after 2 hours; max 2 tabs per day or 8 per month   LEFT FACIAL PAIN / NECK PAIN / SWELLING - follow up with ENT and dentist

## 2020-09-21 NOTE — Progress Notes (Signed)
[facility]  PATIENT: Kendra MOYNAHAN DOB: June 18, 1970  REFERRING CLINICIAN: Dema Severin, NP HISTORY FROM: patient  REASON FOR VISIT: new consult    HISTORICAL  CHIEF COMPLAINT:  Chief Complaint  Patient presents with   New Patient (Initial Visit)    RM 6 alone here for f.u on stroke like sx.  Pt reports on 09/19/2020 she had slurring of speech and difficulty with her works, stroke work up completed at [facility] ED. Pt also headaches and left sided facial pains have been present. She also reports tremors have been present.    HISTORY OF PRESENT ILLNESS:   50 year old female here for evaluation of slurred speech, numbness, facial pain.  Patient has history of headaches since childhood with left forehead pain, photophobia, phonophobia, nausea and seeing blue flashes.  She has family history of migraine as well.  She has never been officially diagnosed with migraine.  She has taken over-the-counter medicines with mild relief.  January 2022 patient had onset of left facial, neck, occipital pain, electrical sensations which has been constant since that time.  09/19/2020 patient was at home when all of a sudden had stuttering and stammering speech, whole body shaking, right arm tremor, left-sided numbness.  She was awake and alert during this time.  She was taken to the hospital for evaluation.  Code stroke was activated but symptoms were felt to be more consistent with complicated migraine or other nonorganic etiology.  CT and MRI were obtained which were negative for any acute findings.  Symptoms gradually improved.  Patient has family history of dermatomyositis, seizure disorder, migraine.  She had recommended spotted fever 3 years ago and was treated.  She also has severe snoring and daytime fatigue.  Her BMI is 52.9.   REVIEW OF SYSTEMS: Full 14 system review of systems performed and negative with exception of: as per HPI.  ALLERGIES: Allergies  Allergen Reactions    Hydromorphone Hcl Other (See Comments)    Reaction unknown   Metronidazole Other (See Comments)    Reaction unknown   Oxycodone-Acetaminophen Other (See Comments)    Reaction unknown   Piperacillin Sod-Tazobactam So Other (See Comments)    Reaction unknown   Prednisone Other (See Comments)    Reaction unknown   Rapaflo [Silodosin] Other (See Comments)    Room spinning, nausea/vomiting, dizziness.   Tetanus Toxoid Other (See Comments)    Reaction unknown    HOME MEDICATIONS: Outpatient Medications Prior to Visit  Medication Sig Dispense Refill   acetaminophen (TYLENOL) 500 MG tablet Take 500 mg by mouth every 6 (six) hours as needed for moderate pain.     ibuprofen (ADVIL,MOTRIN) 800 MG tablet Take 1 tablet (800 mg total) by mouth every 8 (eight) hours as needed. (Patient not taking: Reported on 09/19/2020) 21 tablet 0   loratadine (CLARITIN) 10 MG tablet Take 10 mg by mouth daily as needed for allergies.     naproxen (NAPROSYN) 500 MG tablet Take 1 tablet (500 mg total) by mouth 2 (two) times daily. (Patient not taking: Reported on 09/19/2020) 30 tablet 0   No facility-administered medications prior to visit.    PAST MEDICAL HISTORY: Past Medical History:  Diagnosis Date   Arthritis    Chicken pox    Goiter    Heart murmur    Irritable bowel syndrome    Measles    Migraine     PAST SURGICAL HISTORY: Past Surgical History:  Procedure Laterality Date   WISDOM TOOTH EXTRACTION  FAMILY HISTORY: Family History  Problem Relation Age of Onset   Dermatomyositis Mother    Congestive Heart Failure Mother    Atrial fibrillation Mother    Hypothyroidism Mother    Hiatal hernia Mother    Osteopenia Mother    Diverticulosis Mother    Diabetes Father     SOCIAL HISTORY: Social History   Socioeconomic History   Marital status: Married    Spouse name: Not on file   Number of children: Not on file   Years of education: Not on file   Highest education level: Not on  file  Occupational History   Not on file  Tobacco Use   Smoking status: Never   Smokeless tobacco: Never  Substance and Sexual Activity   Alcohol use: No   Drug use: No   Sexual activity: Not on file  Other Topics Concern   Not on file  Social History Narrative   Not on file   Social Determinants of Health   Financial Resource Strain: Not on file  Food Insecurity: Not on file  Transportation Needs: Not on file  Physical Activity: Not on file  Stress: Not on file  Social Connections: Not on file  Intimate Partner Violence: Not on file     PHYSICAL EXAM  GENERAL EXAM/CONSTITUTIONAL: Vitals:  Vitals:   09/21/20 0847  BP: 124/80  Pulse: 87  SpO2: 97%  Weight: 280 lb (127 kg)  Height: 5\' 1"  (1.549 m)   Body mass index is 52.91 kg/m. Wt Readings from Last 3 Encounters:  09/21/20 280 lb (127 kg)  09/19/20 265 lb 14 oz (120.6 kg)  07/29/16 245 lb (111.1 kg)   Patient is in no distress; well developed, nourished and groomed; neck is supple  CARDIOVASCULAR: Examination of carotid arteries is normal; no carotid bruits Regular rate and rhythm, no murmurs Examination of peripheral vascular system by observation and palpation is normal  EYES: Ophthalmoscopic exam of optic discs and posterior segments is normal; no papilledema or hemorrhages No results found.  MUSCULOSKELETAL: Gait, strength, tone, movements noted in Neurologic exam below  NEUROLOGIC: MENTAL STATUS:  No flowsheet data found. awake, alert, oriented to person, place and time recent and remote memory intact normal attention and concentration language fluent, comprehension intact, naming intact fund of knowledge appropriate  CRANIAL NERVE:  2nd - no papilledema on fundoscopic exam 2nd, 3rd, 4th, 6th - pupils equal and reactive to light, visual fields full to confrontation, extraocular muscles intact, no nystagmus 5th - facial sensation symmetric 7th - facial strength symmetric 8th - hearing  intact 9th - palate elevates symmetrically, uvula midline 11th - shoulder shrug symmetric 12th - tongue protrusion midline  MOTOR:  normal bulk and tone, full strength in the BUE, BLE  SENSORY:  normal and symmetric to light touch, temperature, vibration  COORDINATION:  finger-nose-finger, fine finger movements normal  REFLEXES:  deep tendon reflexes TRACE and symmetric  GAIT/STATION:  narrow based gait     DIAGNOSTIC DATA (LABS, IMAGING, TESTING) - I reviewed patient records, labs, notes, testing and imaging myself where available.  Lab Results  Component Value Date   WBC 5.8 09/19/2020   HGB 13.8 09/19/2020   HCT 43.0 09/19/2020   MCV 89.8 09/19/2020   PLT 220 09/19/2020      Component Value Date/Time   NA 140 09/19/2020 1331   K 4.0 09/19/2020 1331   CL 106 09/19/2020 1331   CO2 26 09/19/2020 1331   GLUCOSE 132 (H) 09/19/2020 1331  BUN 11 09/19/2020 1331   CREATININE 0.94 09/19/2020 1331   CALCIUM 8.8 (L) 09/19/2020 1331   PROT 5.9 (L) 09/19/2020 1331   ALBUMIN 3.2 (L) 09/19/2020 1331   AST 19 09/19/2020 1331   ALT 23 09/19/2020 1331   ALKPHOS 55 09/19/2020 1331   BILITOT 0.4 09/19/2020 1331   GFRNONAA >60 09/19/2020 1331   No results found for: CHOL, HDL, LDLCALC, LDLDIRECT, TRIG, CHOLHDL No results found for: DUKG2R No results found for: VITAMINB12 No results found for: TSH   07/04/15 Negative MRI of the brain with contrast with special attention to the posterior fossa  09/19/20 MRI brain 1. No evidence of acute intracranial abnormality. Specifically, no acute infarct. 2. Suspected prior microhemorrhage in the left cerebellum and prior hemorrhage in the left periventricular frontal lobe, as detailed above.    ASSESSMENT AND PLAN  50 y.o. year old female here with:  Dx:  1. Complicated migraine   2. Snoring       PLAN:  COMPLICATED MIGRAINE + migraine with aura (now daily x 6 months; previously 5-10 per year)  MIGRAINE PREVENTION   LIFESTYLE CHANGES -Stop or avoid smoking -Decrease or avoid caffeine / alcohol -Eat and sleep on a regular schedule -Exercise several times per week - start topiramate 50mg  at bedtime; after 1-2 weeks increase to 50mg  twice a day; drink plenty of water  MIGRAINE RESCUE  - ibuprofen, tylenol as needed - rizatriptan (Maxalt) 10mg  as needed for breakthrough headache; may repeat x 1 after 2 hours; max 2 tabs per day or 8 per month   LEFT FACIAL PAIN / NECK PAIN / SWELLING / h/o thyroid goiter - follow up with ENT and dentist   Orders Placed This Encounter  Procedures   Ambulatory referral to Sleep Studies    Meds ordered this encounter  Medications   topiramate (TOPAMAX) 50 MG tablet    Sig: Take 1 tablet (50 mg total) by mouth 2 (two) times daily.    Dispense:  180 tablet    Refill:  4    Return in about 6 months (around 03/24/2021) for with NP (Amy Lomax).    , MD 09/21/2020, 9:47 AM Certified in Neurology, Neurophysiology and Neuroimaging  [facility] [address] [place], [place] [address] [phone]

## 2020-09-26 ENCOUNTER — Telehealth: Payer: Self-pay | Admitting: Diagnostic Neuroimaging

## 2020-09-26 MED ORDER — RIZATRIPTAN BENZOATE 10 MG PO TABS: 10.0000 mg | ORAL_TABLET | ORAL | 0 refills | Status: AC | PRN

## 2020-09-26 NOTE — Telephone Encounter (Signed)
Pt states she has worsen since on topiramate (TOPAMAX) 50 MG tablet, she is asking for a call to discuss.

## 2020-09-26 NOTE — Addendum Note (Signed)
Addended by: Ann Maki on: 09/26/2020 12:10 PM   Modules accepted: Orders

## 2020-09-26 NOTE — Telephone Encounter (Addendum)
I returned patient's call, and we discussed message.  Patient reports that she started taking the Topamax 50 mg tablet on 09/21/2020.  Patient reports she took 1 tablet on 7/27, and then the next day started taking 2 tablets daily.  Patient has been taking 2 tablets of the Topamax since the 27th except for today.  Patient reports that since starting the Topamax she has noticed she is more emotional, ear have been ringing, she is not sleeping well, neck pain, right and left side tingling, slurred speech, and shakiness has increased.  She also reports her headaches have not improved while taking the Topamax, but denies trying the rizatriptan at this point.  Patient states she did not see the instructions on the AVS that stated for her to take 50 mg at bedtime for 1 to 2 weeks and then titrate up to 2 tablets daily.  Patient also stated the pharmacy([facility]) did not have the prescription for the rizatriptan 10 mg as needed for headaches. Could not find in epic where the prescription was sent, and advised patient we could resend this.    Patient verbalized high anxiety about trying the rizatriptan when she was at home without her husband.  Patient reports her husband works from 11 AM to 11 PM and she would not try the rizatriptan until he is  present in the home due to her history of side effects with new medication.  Patient was advised to start taking the Topamax 50 mg 1 tablet at bedtime for 1 to 2 weeks and monitor her symptoms.  Patient was also advised to pick up the rizatriptan to take PRN for H/A and let us know how she is doing. Pt verbalized understanding and will CB to let us know how she fairs.

## 2020-11-10 ENCOUNTER — Telehealth: Payer: Self-pay | Admitting: Diagnostic Neuroimaging

## 2020-11-10 NOTE — Telephone Encounter (Signed)
Pt called, would like a call from the nurse to make sure taking  topiramate (TOPAMAX) 50 MG tablet correctly. Have been taking only 1 tablet.

## 2020-11-10 NOTE — Telephone Encounter (Signed)
Contacted pt back, reiterated the conversation she had with Megan on 09/26/20. Advised pt on 8/1 she should start taking the Topamax 50 mg 1 tablet at bedtime for 1 to 2 weeks and monitor her symptoms. She can go up to two now as directed.  Pt was appreciative of clarification.

## 2020-12-05 ENCOUNTER — Encounter: Payer: Self-pay | Admitting: Neurology

## 2020-12-05 ENCOUNTER — Ambulatory Visit (INDEPENDENT_AMBULATORY_CARE_PROVIDER_SITE_OTHER): Payer: 59 | Admitting: Neurology

## 2020-12-05 VITALS — BP 120/75 | HR 67 | Ht 61.0 in | Wt 253.0 lb

## 2020-12-05 DIAGNOSIS — R0683 Snoring: Secondary | ICD-10-CM

## 2020-12-05 DIAGNOSIS — G4719 Other hypersomnia: Secondary | ICD-10-CM | POA: Diagnosis not present

## 2020-12-05 DIAGNOSIS — Z820 Family history of epilepsy and other diseases of the nervous system: Secondary | ICD-10-CM

## 2020-12-05 DIAGNOSIS — Z6841 Body Mass Index (BMI) 40.0 and over, adult: Secondary | ICD-10-CM

## 2020-12-05 DIAGNOSIS — R519 Headache, unspecified: Secondary | ICD-10-CM | POA: Diagnosis not present

## 2020-12-05 DIAGNOSIS — R351 Nocturia: Secondary | ICD-10-CM

## 2020-12-05 NOTE — Progress Notes (Signed)
Subjective:    Patient ID: Kendra Robbins is a 50 y.o. female.  HPI   Huston Foley, MD, PhD [facility] [address] [address] [place], [place] [address]  Dear Satira Sark,   I saw your patient, Kendra Robbins, upon your kind request in my sleep clinic today for initial consultation of her sleep disorder, in particular, concern for underlying obstructive sleep apnea.  The patient is unaccompanied today.  As you know, Kendra Robbins is a 50 year old right-handed woman with an underlying medical history of irritable bowel syndrome, migraine headaches, arthritis, goiter, and severe obesity with a BMI of over 45, who reports snoring and excessive daytime somnolence.  I reviewed your office note from 09/21/2020.  Her Epworth sleepiness score is 21 out of 24, fatigue severity score is 57 out of 63.  She lives with her family including husband and 2 children, ages 36 and 58.  She does not currently work.  Bedtime is generally around 10 PM and rise time at 7:45 AM.  She has nocturia about once or twice per average night, she does not currently drink caffeine on a daily basis.  Weight has been more or less stable.  She is scheduled to see ENT for chronic tinnitus.  She is wondering if she has a sinus infection as she reports left facial pain and fullness.  She reports a family history of sleep apnea affecting her mom and her brother.  She has not been sleeping well for the past year, she feels that her sleepiness has increased over the past year.  She has been known to snore loudly for years.  She has been on Topamax for migraine prevention, reports that it has not helped, she feels sleepy from it and also reports having had nightmares.  She is a non-smoker and does not drink alcohol, has no TV in the bedroom.  They have 3 dogs in the household.  Her Past Medical History Is Significant For: Past Medical History:  Diagnosis Date   Arthritis    Chicken pox    Goiter    Heart murmur     Irritable bowel syndrome    Measles    Migraine     Her Past Surgical History Is Significant For: Past Surgical History:  Procedure Laterality Date   WISDOM TOOTH EXTRACTION      Her Family History Is Significant For: Family History  Problem Relation Age of Onset   Dermatomyositis Mother    Congestive Heart Failure Mother    Atrial fibrillation Mother    Hypothyroidism Mother    Hiatal hernia Mother    Osteopenia Mother    Diverticulosis Mother    Sleep apnea Mother    Diabetes Father    Sleep apnea Brother     Her Social History Is Significant For: Social History   Socioeconomic History   Marital status: Married    Spouse name: Not on file   Number of children: Not on file   Years of education: Not on file   Highest education level: Not on file  Occupational History   Not on file  Tobacco Use   Smoking status: Never   Smokeless tobacco: Never  Substance and Sexual Activity   Alcohol use: No   Drug use: No   Sexual activity: Not on file  Other Topics Concern   Not on file  Social History Narrative   Not on file   Social Determinants of Health   Financial Resource Strain: Not  on file  Food Insecurity: Not on file  Transportation Needs: Not on file  Physical Activity: Not on file  Stress: Not on file  Social Connections: Not on file    Her Allergies Are:  Allergies  Allergen Reactions   Hydromorphone Hcl Other (See Comments)    Reaction unknown   Metronidazole Other (See Comments)    Reaction unknown   Oxycodone-Acetaminophen Other (See Comments)    Reaction unknown   Piperacillin Sod-Tazobactam So Other (See Comments)    Reaction unknown   Prednisone Other (See Comments)    Reaction unknown   Rapaflo [Silodosin] Other (See Comments)    Room spinning, nausea/vomiting, dizziness.   Tetanus Toxoid Other (See Comments)    Reaction unknown  :   Her Current Medications Are:  Outpatient Encounter Medications as of 12/05/2020  Medication Sig    acetaminophen (TYLENOL) 500 MG tablet Take 500 mg by mouth as needed for moderate pain.   ibuprofen (ADVIL,MOTRIN) 800 MG tablet Take 1 tablet (800 mg total) by mouth every 8 (eight) hours as needed. (Patient taking differently: Take 800 mg by mouth as needed.)   rizatriptan (MAXALT) 10 MG tablet Take 1 tablet (10 mg total) by mouth as needed for migraine. may repeat x 1 after 2 hours; max 2 tabs per day or 8 per month   topiramate (TOPAMAX) 50 MG tablet Take 1 tablet (50 mg total) by mouth 2 (two) times daily.   loratadine (CLARITIN) 10 MG tablet Take 10 mg by mouth daily as needed for allergies.   naproxen (NAPROSYN) 500 MG tablet Take 1 tablet (500 mg total) by mouth 2 (two) times daily. (Patient not taking: Reported on 09/19/2020)   No facility-administered encounter medications on file as of 12/05/2020.  :   Review of Systems:  Out of a complete 14 point review of systems, all are reviewed and negative with the exception of these symptoms as listed below:  Review of Systems  Neurological:        Pt is here for sleep consult . Pt states she snores, pt states she had headaches and fatigue . Pt denies stop breathing during the night. Pt states she wakes up several times throughout the night.  Pt denies sleep study   FSS:57 ESS:21   Objective:  Neurological Exam  Physical Exam Physical Examination:   Vitals:   12/05/20 1111  BP: 120/75  Pulse: 67    General Examination: The patient is a very pleasant 50 y.o. female in no acute distress. She appears well-developed and well-nourished and well groomed.   HEENT: Normocephalic, atraumatic, pupils are equal, round and reactive to light, extraocular tracking is good without limitation to gaze excursion or nystagmus noted. Hearing is grossly intact. Face is symmetric with normal facial animation. Speech is clear with no dysarthria noted. There is no hypophonia. There is no lip, neck/head, jaw or voice tremor. Neck is supple with full  range of passive and active motion. There are no carotid bruits on auscultation. Oropharynx exam reveals: mild mouth dryness, good dental hygiene and mild airway crowding, due to small airway entry, tonsils on the small side, Mallampati class I.  Uvula also on the smaller side.  Tongue protrudes centrally and palate elevates symmetrically, neck circumference of 14 3/8 inches.  She has a minimal overbite.  Chest: Clear to auscultation without wheezing, rhonchi or crackles noted.  Heart: S1+S2+0, regular and normal without murmurs, rubs or gallops noted.   Abdomen: Soft, non-tender and non-distended with normal bowel  sounds appreciated on auscultation.  Extremities: There is no pitting edema in the distal lower extremities bilaterally.  Nonpitting puffiness noted.  Skin: Warm and dry without trophic changes noted.   Musculoskeletal: exam reveals no obvious joint deformities, tenderness or joint swelling or erythema.   Neurologically:  Mental status: The patient is awake, alert and oriented in all 4 spheres. Her immediate and remote memory, attention, language skills and fund of knowledge are appropriate. There is no evidence of aphasia, agnosia, apraxia or anomia. Speech is clear with normal prosody and enunciation. Thought process is linear. Mood is normal and affect is normal.  Cranial nerves II - XII are as described above under HEENT exam.  Motor exam: Normal bulk, strength and tone is noted. There is no tremor, fine motor skills and coordination: grossly intact.  Cerebellar testing: No dysmetria or intention tremor. There is no truncal or gait ataxia.  Sensory exam: intact to light touch in the upper and lower extremities.  Gait, station and balance: She stands easily. No veering to one side is noted. No leaning to one side is noted. Posture is age-appropriate and stance is narrow based. Gait shows normal stride length and normal pace. No problems turning are noted.   Assessment and Plan:   In summary, Kendra Robbins is a very pleasant 50 y.o.-year old female with an underlying medical history of irritable bowel syndrome, migraine headaches, arthritis, goiter, and severe obesity with a BMI of over 45, whose history and physical exam concerning for obstructive sleep apnea (OSA). I had a long chat with the patient about my findings and the diagnosis of OSA, its prognosis and treatment options. We talked about medical treatments, surgical interventions and non-pharmacological approaches. I explained in particular the risks and ramifications of untreated moderate to severe OSA, especially with respect to developing cardiovascular disease down the Road, including congestive heart failure, difficult to treat hypertension, cardiac arrhythmias, or stroke. Even type 2 diabetes has, in part, been linked to untreated OSA. Symptoms of untreated OSA include daytime sleepiness, memory problems, mood irritability and mood disorder such as depression and anxiety, lack of energy, as well as recurrent headaches, especially morning headaches. We talked about trying to maintain a healthy lifestyle in general, as well as the importance of weight control. We also talked about the importance of good sleep hygiene. I recommended the following at this time: sleep study.  I outlined the difference between a laboratory attended sleep study versus home sleep test.  I explained the sleep test procedure to the patient and also outlined possible surgical and non-surgical treatment options of OSA, including the use of a custom-made dental device (which would require a referral to a specialist dentist or oral surgeon), upper airway surgical options, such as traditional UPPP or a novel less invasive surgical option in the form of Inspire hypoglossal nerve stimulation (which would involve a referral to an ENT surgeon). I also explained the CPAP treatment option to the patient, who indicated that she would be willing to try CPAP  or AutoPap if the need arises. I explained the importance of being compliant with PAP treatment, not only for insurance purposes but primarily to improve Her symptoms, and for the patient's long term health benefit, including to reduce Her cardiovascular risks.  We will pick up our discussion about treatment options after testing and we will keep her posted about her test results by phone call or MyChart messaging.  We will plan a follow-up accordingly as well.  I answered  all her questions today and she was in agreement. Thank you very much for allowing me to participate in the care of this nice patient. If I can be of any further assistance to you please do not hesitate to talk to me.  Sincerely,   Huston Foley, MD, PhD

## 2020-12-05 NOTE — Patient Instructions (Addendum)
It was nice to meet you today.  Here is what we discussed today and what we came up with as our plan for you:    Based on your symptoms and your exam I believe you are at risk for obstructive sleep apnea (aka OSA), and I think we should proceed with a sleep study to determine whether you do or do not have OSA and how severe it is. Even, if you have mild OSA, I may want you to consider treatment with CPAP, as treatment of even borderline or mild sleep apnea can result and improvement of symptoms such as sleep disruption, daytime sleepiness, nighttime bathroom breaks, restless leg symptoms, improvement of headache syndromes, even improved mood disorder.   As explained, an attended sleep study meaning you get to stay overnight in the sleep lab, lets us monitor sleep-related behaviors such as sleep talking and leg movements in sleep, in addition to monitoring for sleep apnea.  A home sleep test is a screening tool for sleep apnea only, and unfortunately does not help with any other sleep-related diagnoses.  Please remember, the long-term risks and ramifications of untreated moderate to severe obstructive sleep apnea are: increased Cardiovascular disease, including congestive heart failure, stroke, difficult to control hypertension, treatment resistant obesity, arrhythmias, especially irregular heartbeat commonly known as A. Fib. (atrial fibrillation); even type 2 diabetes has been linked to untreated OSA.   Sleep apnea can cause disruption of sleep and sleep deprivation in most cases, which, in turn, can cause recurrent headaches, problems with memory, mood, concentration, focus, and vigilance. Most people with untreated sleep apnea report excessive daytime sleepiness, which can affect their ability to drive. Please do not drive if you feel sleepy. Patients with sleep apnea can also develop difficulty initiating and maintaining sleep (aka insomnia).   Having sleep apnea may increase your risk for other sleep  disorders, including involuntary behaviors sleep such as sleep terrors, sleep talking, sleepwalking.    Having sleep apnea can also increase your risk for restless leg syndrome and leg movements at night.   Please note that untreated obstructive sleep apnea may carry additional perioperative morbidity. Patients with significant obstructive sleep apnea (typically, in the moderate to severe degree) should receive, if possible, perioperative PAP (positive airway pressure) therapy and the surgeons and particularly the anesthesiologists should be informed of the diagnosis and the severity of the sleep disordered breathing.   I will likely see you back after your sleep study to go over the test results and where to go from there. We will call you after your sleep study to advise about the results (most likely, you will hear from Megan, my nurse) and to set up an appointment at the time, as necessary.    Our sleep lab administrative assistant will call you to schedule your sleep study and give you further instructions, regarding the check in process for the sleep study, arrival time, what to bring, when you can expect to leave after the study, etc., and to answer any other logistical questions you may have. If you don't hear back from her by about 2 weeks from now, please feel free to call her direct line at [phone] or you can call our general clinic number, or email us through My Chart.    

## 2020-12-13 ENCOUNTER — Telehealth: Payer: Self-pay | Admitting: Neurology

## 2020-12-13 NOTE — Telephone Encounter (Signed)
Called patient and informed her Dr Marjory Lies stated stop topiramate. I advised she let us know in several days how she is doing. She asked what to do if she has another episode of stroke like symptoms like she had before. I advised if she thinks she is having a stroke she needs to go to ED. She stated she isn't afraid of a stroke just doesn't know what to do. I advised she call for any concerns. Patient verbalized understanding, appreciation.

## 2020-12-13 NOTE — Telephone Encounter (Signed)
Called patient who stated she's been taking topamax once day, has tried taking twice a day on 2 separate weekends. It causes her to sleep a lot, have nightmares, be forgetful, body is tingling, tinnitus. 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 She has appointment with ENT for tinnitus and will be scheduled for sleep study. I advised she can stop topamax since she has been taking it daily for the nost part. Let side effects resolve. She stated she's afraid she'll have another episode that mimics a stroke. She is afraid of stopping medicine "cold [place]". I advised will discuss with Dr Marjory Lies and call her back. Patient verbalized understanding, appreciation.

## 2020-12-13 NOTE — Telephone Encounter (Signed)
Pt called states she is having some severe reactions to the topiramate (TOPAMAX) 50 MG tablet. She states she is not able to take the medicine in the morning and at night, she thinks the medicine is causing memory loss, nightmares, body is falling asleep tired all the time, making emotional. Pt states she cannot take this medicine any more, requesting a call back.

## 2021-01-16 ENCOUNTER — Ambulatory Visit: Payer: 59 | Admitting: Neurology

## 2021-01-16 DIAGNOSIS — R0683 Snoring: Secondary | ICD-10-CM

## 2021-01-16 DIAGNOSIS — R519 Headache, unspecified: Secondary | ICD-10-CM

## 2021-01-16 DIAGNOSIS — G4719 Other hypersomnia: Secondary | ICD-10-CM

## 2021-01-16 DIAGNOSIS — R351 Nocturia: Secondary | ICD-10-CM

## 2021-01-16 DIAGNOSIS — Z820 Family history of epilepsy and other diseases of the nervous system: Secondary | ICD-10-CM

## 2021-01-23 ENCOUNTER — Ambulatory Visit (INDEPENDENT_AMBULATORY_CARE_PROVIDER_SITE_OTHER): Payer: 59 | Admitting: Neurology

## 2021-01-23 DIAGNOSIS — R519 Headache, unspecified: Secondary | ICD-10-CM

## 2021-01-23 DIAGNOSIS — G4733 Obstructive sleep apnea (adult) (pediatric): Secondary | ICD-10-CM | POA: Diagnosis not present

## 2021-01-23 DIAGNOSIS — G4719 Other hypersomnia: Secondary | ICD-10-CM

## 2021-01-23 DIAGNOSIS — R0683 Snoring: Secondary | ICD-10-CM

## 2021-01-23 DIAGNOSIS — R351 Nocturia: Secondary | ICD-10-CM

## 2021-01-23 DIAGNOSIS — Z6841 Body Mass Index (BMI) 40.0 and over, adult: Secondary | ICD-10-CM

## 2021-01-23 DIAGNOSIS — Z820 Family history of epilepsy and other diseases of the nervous system: Secondary | ICD-10-CM

## 2021-01-31 NOTE — Progress Notes (Signed)
     [facility]  HOME SLEEP TEST (Watch PAT) REPORT  STUDY DATE: 01/23/2021  DOB: [birth year]  MRN: [id number]  ORDERING CLINICIAN: Huston Foley, MD, PhD   REFERRING CLINICIAN: Dr. Marjory Lies  CLINICAL INFORMATION/HISTORY: 50 year old right-handed woman with an underlying medical history of irritable bowel syndrome, migraine headaches, arthritis, goiter, and severe obesity with a BMI of over 45, who reports snoring and excessive daytime somnolence.   Epworth sleepiness score: 21/24.  BMI: 47.9 kg/m  FINDINGS:   Sleep Summary:   Total Recording Time (hours, min): 8 hours, 31 minutes  Total Sleep Time (hours, min):  8 hours, 7 minutes   Percent REM (%):    25.5%   Respiratory Indices:   Calculated pAHI (per hour):  6.9/hour         REM pAHI:    17.2/hour       NREM pAHI: 3.5/hour  Oxygen Saturation Statistics:    Oxygen Saturation (%) Mean: 93%   Minimum oxygen saturation (%):                 84%   O2 Saturation Range (%): 84-97%    O2 Saturation (minutes) <=88%: 0.6 min  Pulse Rate Statistics:   Pulse Mean (bpm):    75/min    Pulse Range (60-100/min)   IMPRESSION: OSA (obstructive sleep apnea), mild  RECOMMENDATION:  This home sleep test demonstrates overall mild obstructive sleep apnea with a total AHI of 6.9/hour and O2 nadir of 84%.  Mild intermittent snoring was noted, at times in the moderate range.  Given the patient's medical history and sleep related complaints, treatment with positive airway pressure can be considered, treatment can be achieved in the form of autoPAP trial/titration at home. A  full night CPAP titration study will help with proper treatment settings and mask fitting if needed. Alternative treatments include may include weight loss along with avoidance of the supine sleep position, or an oral appliance in appropriate candidates.   Please note that untreated obstructive sleep apnea may carry additional perioperative  morbidity. Patients with significant obstructive sleep apnea should receive perioperative PAP therapy and the surgeons and particularly the anesthesiologist should be informed of the diagnosis and the severity of the sleep disordered breathing. The patient should be cautioned not to drive, work at heights, or operate dangerous or heavy equipment when tired or sleepy. Review and reiteration of good sleep hygiene measures should be pursued with any patient. Other causes of the patient's symptoms, including circadian rhythm disturbances, an underlying mood disorder, medication effect and/or an underlying medical problem cannot be ruled out based on this test. Clinical correlation is recommended.   The patient and her referring provider will be notified of the test results. The patient will be seen in follow up in sleep clinic at [facility].  I certify that I have reviewed the raw data recording prior to the issuance of this report in accordance with the standards of the American Academy of Sleep Medicine (AASM).  INTERPRETING PHYSICIAN:   Huston Foley, MD, PhD  Board Certified in Neurology and Sleep Medicine  [facility] [address], [address] [place], [place] [address] [phone]

## 2021-02-02 NOTE — Procedures (Signed)
     [facility]  HOME SLEEP TEST (Watch PAT) REPORT  STUDY DATE: 01/23/2021  DOB: [birth year]  MRN: [id number]  ORDERING CLINICIAN: Huston Foley, MD, PhD   REFERRING CLINICIAN: Dr. Marjory Lies  CLINICAL INFORMATION/HISTORY: 50 year old right-handed woman with an underlying medical history of irritable bowel syndrome, migraine headaches, arthritis, goiter, and severe obesity with a BMI of over 45, who reports snoring and excessive daytime somnolence.   Epworth sleepiness score: 21/24.  BMI: 47.9 kg/m  FINDINGS:   Sleep Summary:   Total Recording Time (hours, min): 8 hours, 31 minutes  Total Sleep Time (hours, min):  8 hours, 7 minutes   Percent REM (%):    25.5%   Respiratory Indices:   Calculated pAHI (per hour):  6.9/hour         REM pAHI:    17.2/hour       NREM pAHI: 3.5/hour  Oxygen Saturation Statistics:    Oxygen Saturation (%) Mean: 93%   Minimum oxygen saturation (%):                 84%   O2 Saturation Range (%): 84-97%    O2 Saturation (minutes) <=88%: 0.6 min  Pulse Rate Statistics:   Pulse Mean (bpm):    75/min    Pulse Range (60-100/min)   IMPRESSION: OSA (obstructive sleep apnea), mild  RECOMMENDATION:  This home sleep test demonstrates overall mild obstructive sleep apnea with a total AHI of 6.9/hour and O2 nadir of 84%.  Mild intermittent snoring was noted, at times in the moderate range.  Given the patient's medical history and sleep related complaints, treatment with positive airway pressure can be considered, treatment can be achieved in the form of autoPAP trial/titration at home. A  full night CPAP titration study will help with proper treatment settings and mask fitting if needed. Alternative treatments include may include weight loss along with avoidance of the supine sleep position, or an oral appliance in appropriate candidates.   Please note that untreated obstructive sleep apnea may carry additional perioperative  morbidity. Patients with significant obstructive sleep apnea should receive perioperative PAP therapy and the surgeons and particularly the anesthesiologist should be informed of the diagnosis and the severity of the sleep disordered breathing. The patient should be cautioned not to drive, work at heights, or operate dangerous or heavy equipment when tired or sleepy. Review and reiteration of good sleep hygiene measures should be pursued with any patient. Other causes of the patient's symptoms, including circadian rhythm disturbances, an underlying mood disorder, medication effect and/or an underlying medical problem cannot be ruled out based on this test. Clinical correlation is recommended.   The patient and her referring provider will be notified of the test results. The patient will be seen in follow up in sleep clinic at [facility].  I certify that I have reviewed the raw data recording prior to the issuance of this report in accordance with the standards of the American Academy of Sleep Medicine (AASM).  INTERPRETING PHYSICIAN:   Huston Foley, MD, PhD  Board Certified in Neurology and Sleep Medicine  [facility] [address], [address] [place], [place] [address] [phone]

## 2021-02-07 ENCOUNTER — Telehealth: Payer: Self-pay | Admitting: *Deleted

## 2021-02-07 ENCOUNTER — Encounter: Payer: Self-pay | Admitting: *Deleted

## 2021-02-07 NOTE — Telephone Encounter (Signed)
I agree that it would make sense to start AutoPap therapy after she has had her sinus surgery and has healed.  She can also discuss with ENT when it would be a safe time to get started on treatment.  According to that she can also arrange with the DME company her pickup date and time for set up because that we will determine the start of her 90-day window (for compliance purposes).  I would say we can go ahead and process the referral to the DME and she will a start date accordingly.

## 2021-02-07 NOTE — Telephone Encounter (Addendum)
I called pt back.  I relayed that per Dr. Frances Furbish it is ok to start after ENT surgery and healed.  Will go and send order so that process will start, as it is sometimes a 10-12 week time frame of getting machine.  She verbalized understanding.  Order sent.  Mailed letter.

## 2021-02-07 NOTE — Telephone Encounter (Signed)
Pt returned phone call. Would like a call back. 

## 2021-02-07 NOTE — Telephone Encounter (Signed)
-----   Message from Huston Foley, MD sent at 02/02/2021  5:59 PM EST ----- Patient referred by Dr. Marjory Lies, seen by me on 12/05/2020, she had a home sleep test on 01/23/2021.    Please call and notify the patient that the recent home sleep test showed obstructive sleep apnea. OSA is overall mild, but worth treating to see if she feels better after treatment. To that end I recommend treatment for this in the form of autoPAP, which means, that we don't have to bring her in for a sleep study with CPAP, but will let her try an autoPAP machine at home, through a DME company (of her choice, or as per insurance requirement). The DME representative will educate her on how to use the machine, how to put the mask on, etc. I have placed an order in the chart. Please send referral, talk to patient, send report to referring MD. We will need a FU in sleep clinic for 10 weeks post-PAP set up, please arrange that with me or one of our NPs. Thanks,   Huston Foley, MD, PhD [facility])

## 2021-02-07 NOTE — Telephone Encounter (Signed)
Spoke with patient and discussed the sleep study results. She is aware Dr Frances Furbish reviewed the home sleep test and found that it does show pt has mild OSA but it is worth treating with autoPAP to see if she feels better. The patient verbalized understanding. She mentioned that she was able to see an ENT doctor who ordered a CT ofe her sinuses. Pt states the "whole left side"  except for a small portion is "full of snot". She said she is miserable and will have surgery between now and January 4th.  The patient feels at this time that starting to use the machine while her nose is congested is counterproductive.  She stated if Dr. Frances Furbish has different thoughts please let her know but she does think she would rather try autoPAP after she has her surgery. She is going to discuss with her husband whether to have a DME company process now or wait until after her surgery. She also is aware that she would need to follow-up in the sleep clinic 30-90 days after she receives her machine and have 30-90 days of data.

## 2021-02-13 NOTE — Telephone Encounter (Signed)
Ross Ludwig, RN; [name] got it!      Previous Messages   ----- Message -----  From: Guy Begin, RN  Sent: 02/07/2021   2:51 PM EST  To: [name], [name], *  Subject: new user                                       New order in Epic for new user.   Kendra Robbins  Female, 50 y.o., [birth year]  MRN:  [id number]  Phone:  [phone]   She wants to start process (will have ENT surgery in 03-01-2021) and once healed and released from ENT then will start autopap.  FYI only.  Thanks.   Electronics engineer

## 2021-03-28 ENCOUNTER — Ambulatory Visit: Payer: 59 | Admitting: Family Medicine

## 2021-05-09 NOTE — Patient Instructions (Addendum)
Below is our plan: ? ?We will continue to monitor symptoms. Follow up as planned with ENT. Let us know if/when you are ready to start CPAP. You can use rizatriptan as needed for any concerns of migraines.  ? ?Please make sure you are staying well hydrated. I recommend 50-60 ounces daily. Well balanced diet and regular exercise encouraged. Consistent sleep schedule with 6-8 hours recommended.  ? ?Please continue follow up with care team as directed.  ? ?Follow up with me as needed  ? ?You may receive a survey regarding today's visit. I encourage you to leave honest feed back as I do use this information to improve patient care. Thank you for seeing me today!  ? ? ?

## 2021-05-09 NOTE — Progress Notes (Signed)
? ? ?Chief Complaint  ?Patient presents with  ? Follow-up  ?  RM 1, alone. Last seen 12/05/20.   ? ? ? ?HISTORY OF PRESENT ILLNESS: ? ?05/10/21 ALL:  ?Kendra Robbins is a 51 y.o. female here today for follow up for migraines and recently diagnosed OSA. She is followed by Dr Marjory Lies for complicated migraines 50mg  BID and rizatriptan as needed. She was seen in consult with Dr and [facility] 12/2020 showed mild OSA with total AHI 6.9/hr, REM AHI 17.2/hr and O2 nadir 84%. She was planning to have sinus surgery but wished to start AutoPAP once healed. Since, she reports that she is doing ok. She was not able to tolerate topiramate. She was having numbness of both arms (sometimes whole body) and nightmares. She stopped medication after about a month. She reports migraines significantly improved. She underwent sinus surgery 03/15/2021. She feels headaches are starting to get better. She reports over the past week, she has not had a headache at all. She went to pick up CPAP about 2 weeks ago. She could not tolerate the pressure settings at lowest setting. She did not feel that she was going to be able to comply with therapy and chose to wait on picking up machine. She has follow up scheduled with ENT next month. She was advised that she may have to see an oral surgeon as well due to severity of sinus disease.  ? ? ?REVIEW OF SYSTEMS: Out of a complete 14 system review of symptoms, the patient complains only of the following symptoms, sinus congestion, and all other reviewed systems are negative. ? ? ?ALLERGIES: ?Allergies  ?Allergen Reactions  ? Hydromorphone Hcl Other (See Comments)  ?  Reaction unknown  ? Metronidazole Other (See Comments)  ?  Reaction unknown  ? Oxycodone-Acetaminophen Other (See Comments)  ?  Reaction unknown  ? Piperacillin Sod-Tazobactam So Other (See Comments)  ?  Reaction unknown  ? Prednisone Other (See Comments)  ?  Reaction unknown  ? Rapaflo [Silodosin] Other (See Comments)  ?  Room spinning,  nausea/vomiting, dizziness.  ? Tetanus Toxoid Other (See Comments)  ?  Reaction unknown  ? Topamax [Topiramate]   ?  causes her to sleep a lot, have nightmares, be forgetful, body is tingling, tinnitus  ? ? ? ?HOME MEDICATIONS: ?Outpatient Medications Prior to Visit  ?Medication Sig Dispense Refill  ? acetaminophen (TYLENOL) 500 MG tablet Take 500 mg by mouth as needed for moderate pain.    ? ibuprofen (ADVIL,MOTRIN) 800 MG tablet Take 1 tablet (800 mg total) by mouth every 8 (eight) hours as needed. (Patient taking differently: Take 800 mg by mouth as needed.) 21 tablet 0  ? rizatriptan (MAXALT) 10 MG tablet Take 1 tablet (10 mg total) by mouth as needed for migraine. may repeat x 1 after 2 hours; max 2 tabs per day or 8 per month 10 tablet 0  ? loratadine (CLARITIN) 10 MG tablet Take 10 mg by mouth daily as needed for allergies.    ? naproxen (NAPROSYN) 500 MG tablet Take 1 tablet (500 mg total) by mouth 2 (two) times daily. (Patient not taking: Reported on 09/19/2020) 30 tablet 0  ? topiramate (TOPAMAX) 50 MG tablet Take 1 tablet (50 mg total) by mouth 2 (two) times daily. 180 tablet 4  ? ?No facility-administered medications prior to visit.  ? ? ? ?PAST MEDICAL HISTORY: ?Past Medical History:  ?Diagnosis Date  ? Arthritis   ? Chicken pox   ? Goiter   ?  Heart murmur   ? Irritable bowel syndrome   ? Measles   ? Migraine   ? ? ? ?PAST SURGICAL HISTORY: ?Past Surgical History:  ?Procedure Laterality Date  ? WISDOM TOOTH EXTRACTION    ? ? ? ?FAMILY HISTORY: ?Family History  ?Problem Relation Age of Onset  ? Dermatomyositis Mother   ? Congestive Heart Failure Mother   ? Atrial fibrillation Mother   ? Hypothyroidism Mother   ? Hiatal hernia Mother   ? Osteopenia Mother   ? Diverticulosis Mother   ? Sleep apnea Mother   ? Diabetes Father   ? Sleep apnea Brother   ? ? ? ?SOCIAL HISTORY: ?Social History  ? ?Socioeconomic History  ? Marital status: Married  ?  Spouse name: Not on file  ? Number of children: Not on file  ?  Years of education: Not on file  ? Highest education level: Not on file  ?Occupational History  ? Not on file  ?Tobacco Use  ? Smoking status: Never  ? Smokeless tobacco: Never  ?Substance and Sexual Activity  ? Alcohol use: No  ? Drug use: No  ? Sexual activity: Not on file  ?Other Topics Concern  ? Not on file  ?Social History Narrative  ? Not on file  ? ?Social Determinants of Health  ? ?Financial Resource Strain: Not on file  ?Food Insecurity: Not on file  ?Transportation Needs: Not on file  ?Physical Activity: Not on file  ?Stress: Not on file  ?Social Connections: Not on file  ?Intimate Partner Violence: Not on file  ? ? ? ?PHYSICAL EXAM ? ?Vitals:  ? 05/10/21 0945  ?BP: 122/74  ?Pulse: 89  ?Weight: 255 lb (115.7 kg)  ?Height: 5\' 1"  (1.549 m)  ? ?Body mass index is 48.18 kg/m?. ? ?Generalized: Well developed, in no acute distress ? ?Cardiology: normal rate and rhythm, no murmur auscultated  ?Respiratory: clear to auscultation bilaterally   ? ?Neurological examination  ?Mentation: Alert oriented to time, place, history taking. Follows all commands speech and language fluent ?Cranial nerve II-XII: Pupils were equal round reactive to light. Extraocular movements were full, visual field were full on confrontational test. Facial sensation and strength were normal. Head turning and shoulder shrug  were normal and symmetric. ?Motor: The motor testing reveals 5 over 5 strength of all 4 extremities. Good symmetric motor tone is noted throughout.  ?Sensory: Sensory testing is intact to soft touch on all 4 extremities. No evidence of extinction is noted.  ?Coordination: Cerebellar testing reveals good finger-nose-finger and heel-to-shin bilaterally.  ?Gait and station: Gait is normal.   ? ? ?DIAGNOSTIC DATA (LABS, IMAGING, TESTING) ?- I reviewed patient records, labs, notes, testing and imaging myself where available. ? ?Lab Results  ?Component Value Date  ? WBC 5.8 09/19/2020  ? HGB 13.8 09/19/2020  ? HCT 43.0  09/19/2020  ? MCV 89.8 09/19/2020  ? PLT 220 09/19/2020  ? ?   ?Component Value Date/Time  ? NA 140 09/19/2020 1331  ? K 4.0 09/19/2020 1331  ? CL 106 09/19/2020 1331  ? CO2 26 09/19/2020 1331  ? GLUCOSE 132 (H) 09/19/2020 1331  ? BUN 11 09/19/2020 1331  ? CREATININE 0.94 09/19/2020 1331  ? CALCIUM 8.8 (L) 09/19/2020 1331  ? PROT 5.9 (L) 09/19/2020 1331  ? ALBUMIN 3.2 (L) 09/19/2020 1331  ? AST 19 09/19/2020 1331  ? ALT 23 09/19/2020 1331  ? ALKPHOS 55 09/19/2020 1331  ? BILITOT 0.4 09/19/2020 1331  ? GFRNONAA >60 09/19/2020  1331  ? ?No results found for: CHOL, HDL, LDLCALC, LDLDIRECT, TRIG, CHOLHDL ?No results found for: HGBA1C ?No results found for: VITAMINB12 ?No results found for: TSH ? ?No flowsheet data found. ? ? ?No flowsheet data found. ? ? ?ASSESSMENT AND PLAN ? ?51 y.o. year old female  has a past medical history of Arthritis, Chicken pox, Goiter, Heart murmur, Irritable bowel syndrome, Measles, and Migraine. here with  ? ? ?Complicated migraine ? ?Obstructive sleep apnea ? ?Kendra Robbins is doing well, today. She reports headaches are slowly improving following nasal surgery and she has not had any further migraine episodes. She was not able to tolerate topiramate. She has not needed to take rizatriptan. We have reviewed sleep study report and discussed risks of untreated sleep apnea. Total AHI was mild. She does not feel that she can tolerate pressure of CPAP and wishes to work on healthy lifestyle habits. We have discussed option of trying an oral appliance if needed. She is planning to see ENT next month and feels that oral surgery may be needed in the near future. She will focus on healthy lifestyle habits. We could repeat HST if she loses some weight. She may use rizatriptan as needed. She was encouraged to continue close follow up with care team and call me as needed.  ? ? ?No orders of the defined types were placed in this encounter. ? ? ? ?No orders of the defined types were placed in this  encounter. ? ? ? ? ?Shawnie DapperAmy Mase Dhondt, MSN, FNP-C 05/10/2021, 10:18 AM ? ?[facility] ?[address], [address] ?[place], [place] [address] ?[phone] ? ?

## 2021-05-10 ENCOUNTER — Ambulatory Visit (INDEPENDENT_AMBULATORY_CARE_PROVIDER_SITE_OTHER): Payer: 59 | Admitting: Family Medicine

## 2021-05-10 ENCOUNTER — Other Ambulatory Visit: Payer: Self-pay

## 2021-05-10 ENCOUNTER — Encounter: Payer: Self-pay | Admitting: Family Medicine

## 2021-05-10 VITALS — BP 122/74 | HR 89 | Ht 61.0 in | Wt 255.0 lb

## 2021-05-10 DIAGNOSIS — G43109 Migraine with aura, not intractable, without status migrainosus: Secondary | ICD-10-CM | POA: Diagnosis not present

## 2021-05-10 DIAGNOSIS — G4733 Obstructive sleep apnea (adult) (pediatric): Secondary | ICD-10-CM | POA: Diagnosis not present

## 2021-12-25 ENCOUNTER — Ambulatory Visit (INDEPENDENT_AMBULATORY_CARE_PROVIDER_SITE_OTHER): Payer: 59 | Admitting: Podiatry

## 2021-12-25 ENCOUNTER — Ambulatory Visit (INDEPENDENT_AMBULATORY_CARE_PROVIDER_SITE_OTHER): Payer: 59

## 2021-12-25 DIAGNOSIS — M778 Other enthesopathies, not elsewhere classified: Secondary | ICD-10-CM | POA: Diagnosis not present

## 2021-12-25 DIAGNOSIS — S92202A Fracture of unspecified tarsal bone(s) of left foot, initial encounter for closed fracture: Secondary | ICD-10-CM

## 2021-12-25 DIAGNOSIS — M19072 Primary osteoarthritis, left ankle and foot: Secondary | ICD-10-CM | POA: Diagnosis not present

## 2021-12-25 DIAGNOSIS — M792 Neuralgia and neuritis, unspecified: Secondary | ICD-10-CM | POA: Diagnosis not present

## 2021-12-25 MED ORDER — GABAPENTIN 100 MG PO CAPS: 100.0000 mg | ORAL_CAPSULE | Freq: Every day | ORAL | 3 refills | Status: AC

## 2021-12-25 MED ORDER — GABAPENTIN 100 MG PO CAPS: 100.0000 mg | ORAL_CAPSULE | Freq: Every day | ORAL | 3 refills | Status: DC

## 2021-12-25 NOTE — Progress Notes (Unsigned)
Subjective:  Patient ID: Kendra Robbins, female    DOB: 10/24/1970,  MRN: [id number]  Chief Complaint  Patient presents with   Foot Pain    Left foot pain to the top of foot. A bowling ball fell on top of her foot. Bilateral great toe numbness.     51 y.o. female presents with concern to pain in the top of the left foot.  She does report a bowling ball previously fell on her left foot a while ago couple months at least.  She also reports bilateral foot numbness burning stinging tingling pains.  She does not take any medication for neuropathy or neuropathic pain.  Past Medical History:  Diagnosis Date   Arthritis    Chicken pox    Goiter    Heart murmur    Irritable bowel syndrome    Measles    Migraine     Allergies  Allergen Reactions   Hydromorphone Hcl Other (See Comments)    Reaction unknown   Metronidazole Other (See Comments)    Reaction unknown   Oxycodone-Acetaminophen Other (See Comments)    Reaction unknown   Piperacillin Sod-Tazobactam So Other (See Comments)    Reaction unknown   Prednisone Other (See Comments)    Reaction unknown   Rapaflo [Silodosin] Other (See Comments)    Room spinning, nausea/vomiting, dizziness.   Tetanus Toxoid Other (See Comments)    Reaction unknown   Topamax [Topiramate]     causes her to sleep a lot, have nightmares, be forgetful, body is tingling, tinnitus    ROS: Negative except as per HPI above  Objective:  General: AAO x3, NAD  Dermatological: With inspection and palpation of the right and left lower extremities there are no open sores, no preulcerative lesions, no rash or signs of infection present. Nails are of normal length thickness and coloration.   Vascular:  Dorsalis Pedis artery and Posterior Tibial artery pedal pulses are 2/4 bilateral.  Capillary fill time < 3 sec to all digits.   Neruologic: Grossly intact via light touch bilateral. Protective threshold intact to all sites bilateral.   Musculoskeletal: Pain  with palpation at the dorsolateral midfoot at the calcaneocuboid articulation medial aspect.   Gait: Unassisted, Nonantalgic.   No images are attached to the encounter.  Radiographs:  Date: 12/25/21 XR the left foot Weightbearing AP/Lateral/Oblique   Findings: Attention directed to the medial proximal aspect of the cuboid there is questionable old fracture present at the medial corner of the cuboid  proximal with diminished joint space of the calcaneocuboid joint as well as some arthritic changes about the midfoot near the articulation of the calcaneocuboid and in the space between the calcaneus and the navicular. Assessment:   1. Arthritis of left midfoot   2. Fracture of unspecified tarsal bone(s) of left foot, initial encounter for closed fracture   3. Capsulitis of foot, right   4. Neuropathic pain      Plan:  Patient was evaluated and treated and all questions answered.  #Left midfoot arthritic changes likely posttraumatic in nature with concern for old nonhealed fracture of the proximal medial aspect of the cuboid. -I discussed with the patient that she may have some arthritic changes at the midfoot at the calcaneocuboid joint as well as possible arthritic changes in the midtarsal joint of the left foot.  This is likely related to her history of having a bowling ball drop on the left foot. -I wrote that we proceed with power step orthotics to try  and offload her lateral midfoot and decreased pronation which could be causing some additional pain at this area. -I recommend she try Voltaren gel topical anti-inflammatory for arthritic pain. -Power steps will be dispensed at the patient's husband's visit which is tomorrow per request  #Neuropathic pain bilateral foot -Related the patient's other complaint of burning tingling sensation especially in the forefoot and the toes.  I believe this may be related to neuropathic pain. -Recommend trying gabapentin 100 mg daily at night prior to  going to bed to see if this has any effect on the burning shooting tingling pain she is experiencing.  Return in about 2 months (around 02/24/2022) for follow up neuropathic pain/ foot fracture.          Everitt Amber, DPM [facility]

## 2022-03-02 ENCOUNTER — Ambulatory Visit: Payer: 59 | Admitting: Podiatry

## 2023-03-22 IMAGING — CT CT HEAD CODE STROKE
3 series · 15 of 47 positions shown, 18 images · non-contrast
Comparison: MRI 07/04/2015

CLINICAL DATA: Code stroke. Left-sided weakness. Code stroke
presentation.

EXAM:
CT HEAD WITHOUT CONTRAST
TECHNIQUE: Contiguous axial images were obtained from the base of the skull
through the vertex without intravenous contrast.

[Series 3: head 5.0 h30s · axial · 0.48mm/px · z∈[-141,+4]mm · 9 of 35 slices shown, 12 images]
[im 3/35  brain]
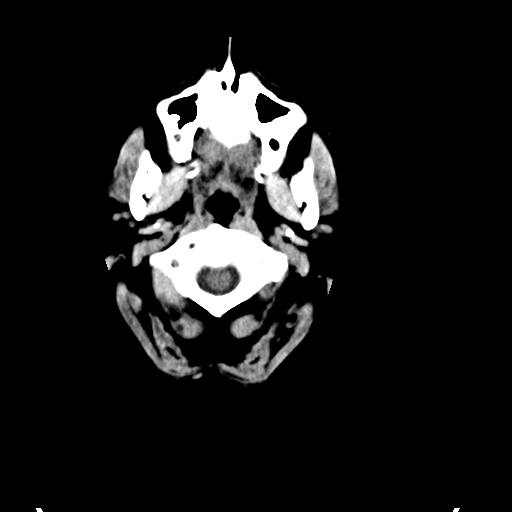
[im 3/35  bone]
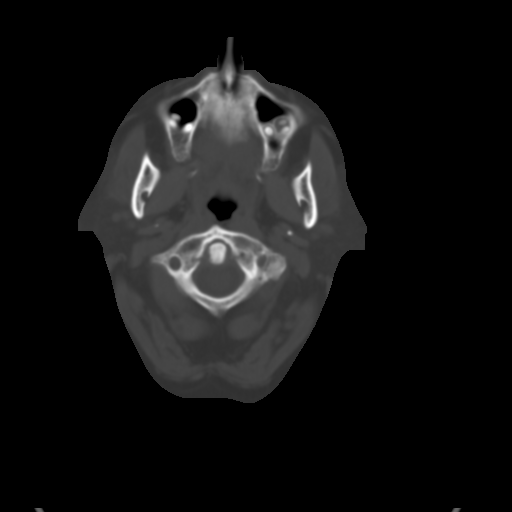
[im 6/35  brain]
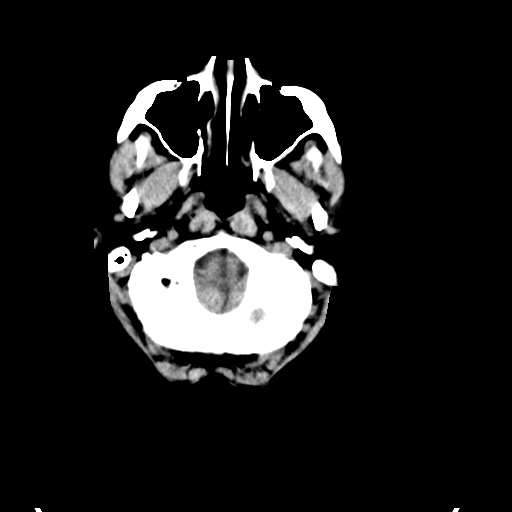
[im 10/35  brain]
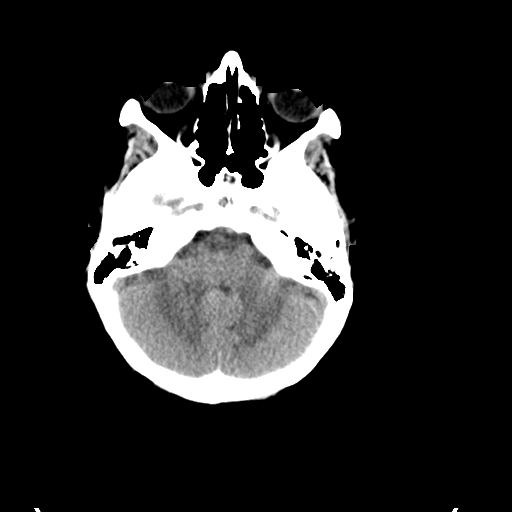
[im 13/35  brain]
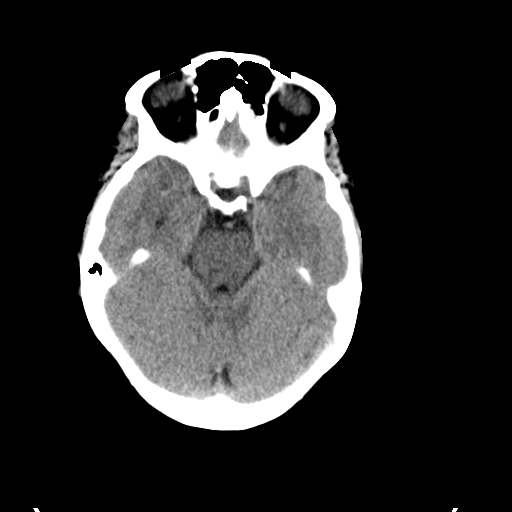
[im 18/35  brain]
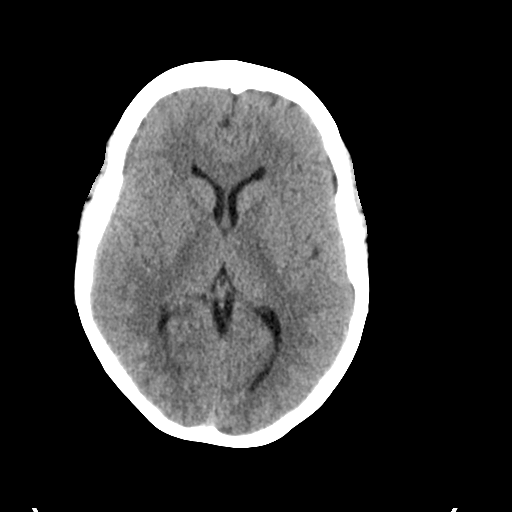
[im 18/35  bone]
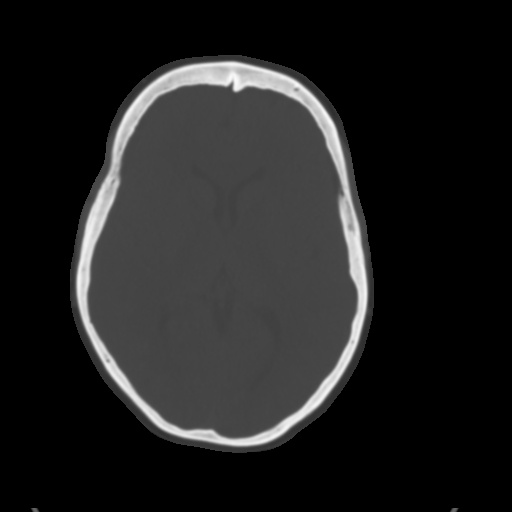
[im 22/35  brain]
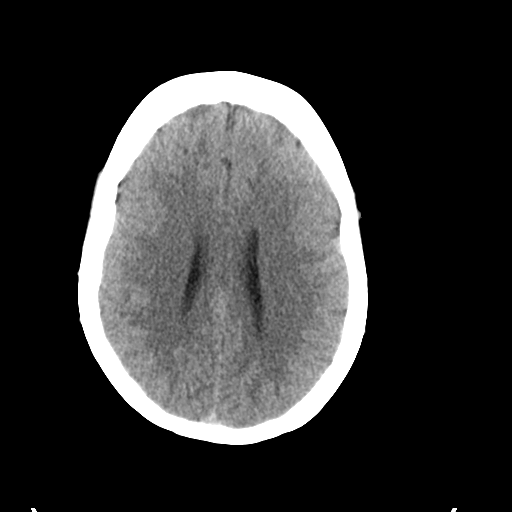
[im 25/35  brain]
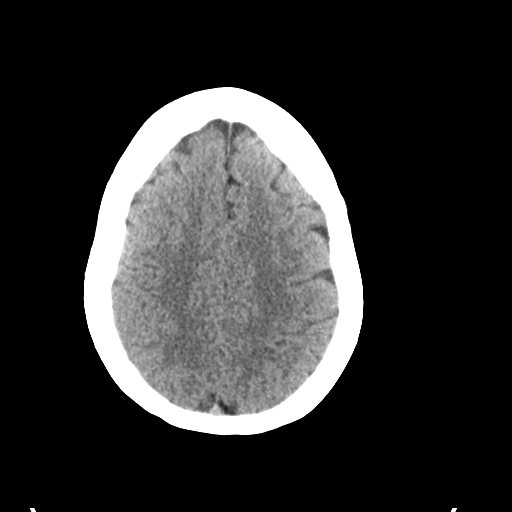
[im 29/35  brain]
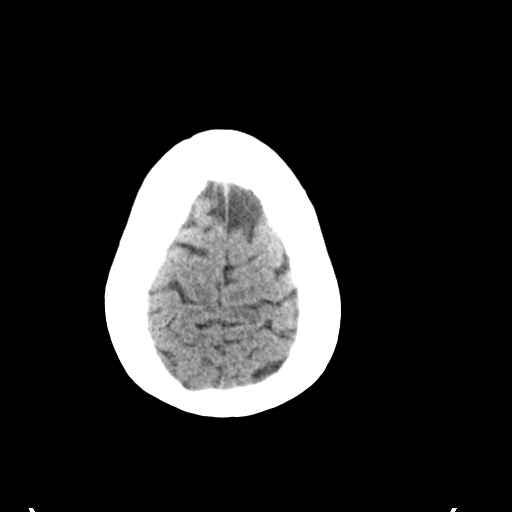
[im 32/35  brain]
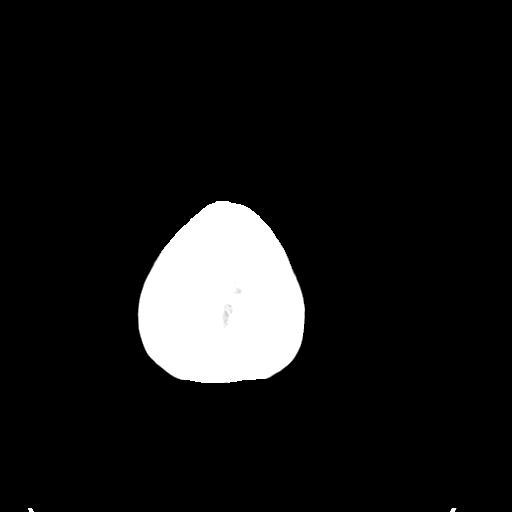
[im 32/35  bone]
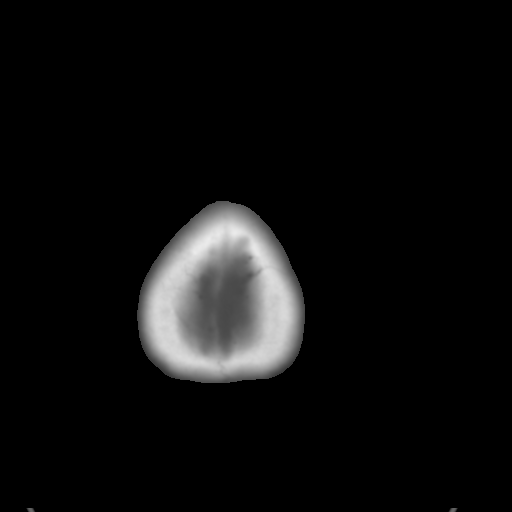

[Series 6: head 3.0 mpr cor · coronal · 0.35mm/px · 3 of 71 slices shown]
[im 24/71  brain]
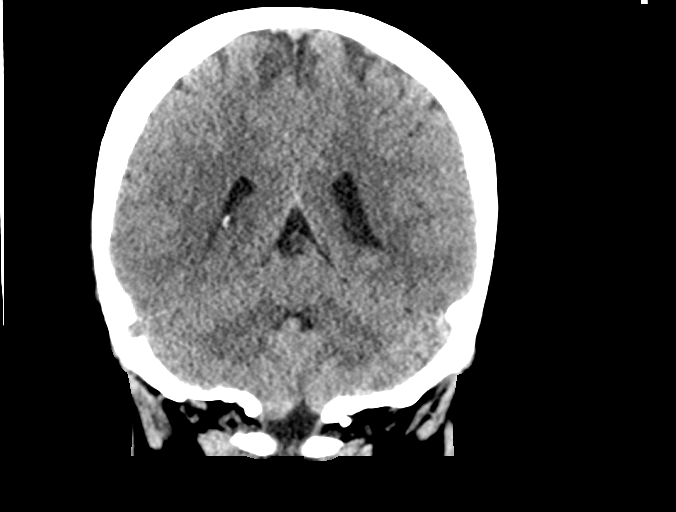
[im 32/71  brain]
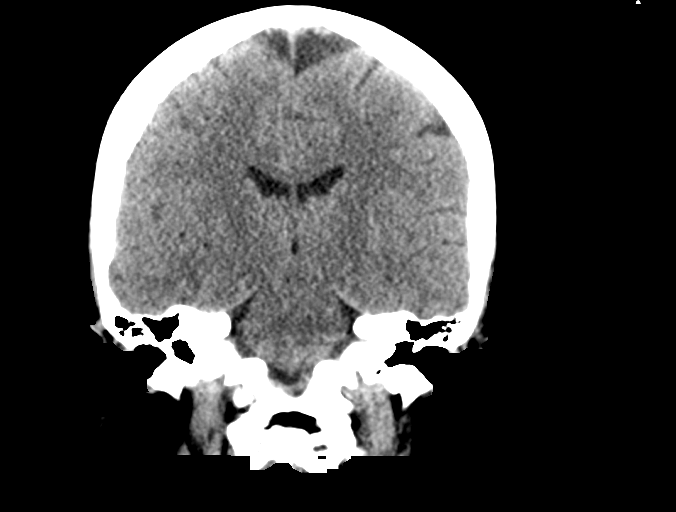
[im 39/71  brain]
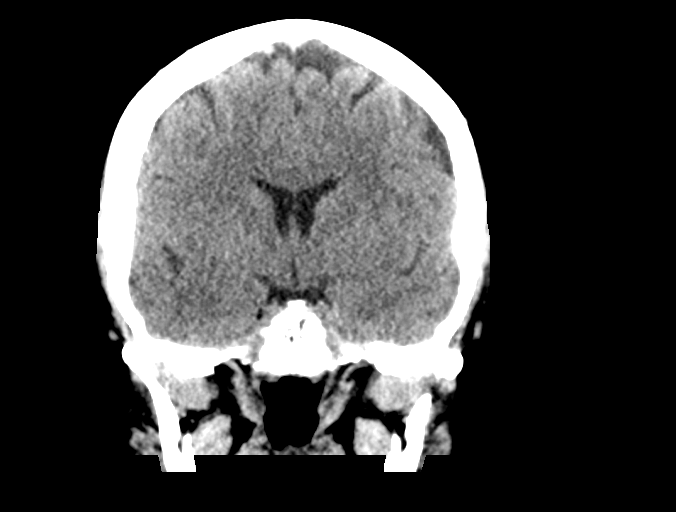

[Series 7: head 3.0 mpr sag · sagittal · 0.36mm/px · 3 of 67 slices shown]
[im 23/67  brain]
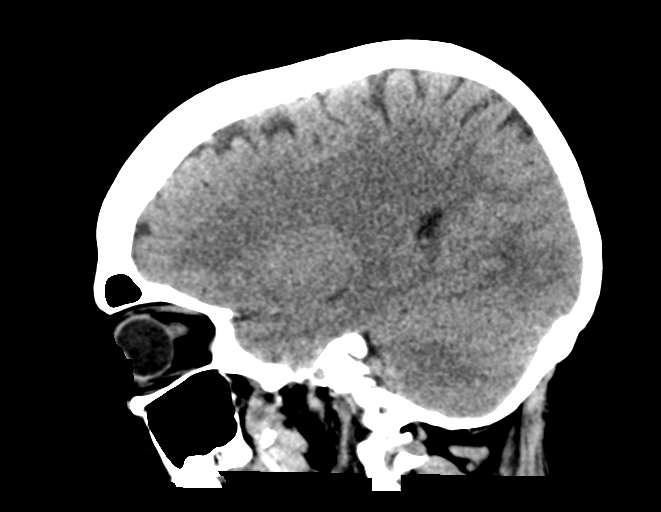
[im 34/67  brain]
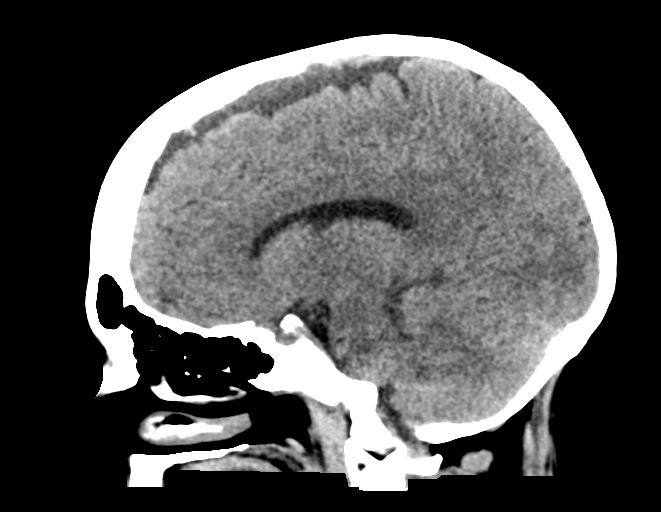
[im 45/67  brain]
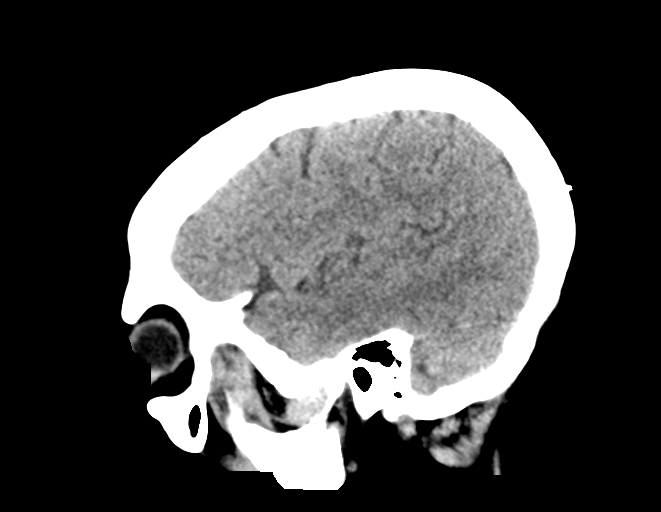

[15 of 47 positions shown; findings below may reference images not displayed]

FINDINGS: Brain: The brain has normal appearance without evidence of atrophy,
old or acute infarction, mass lesion, hemorrhage, hydrocephalus or
extra-axial collection.

Vascular: No abnormal vascular finding.

Skull: Normal

Sinuses/Orbits: Clear/normal

Other: None

ASPECTS (Alberta Stroke Program Early CT Score)

- Ganglionic level infarction (caudate, lentiform nuclei, internal
capsule, insula, M1-M3 cortex): 7

- Supraganglionic infarction (M4-M6 cortex): 3

Total score (0-10 with 10 being normal): 10
IMPRESSION: 1. Normal head CT.
2. ASPECTS is 10
3. These results were communicated to Dr. Matras at [DATE] on
09/19/2020 by text page via the [employer] messaging system.

## 2023-03-22 IMAGING — MR MR HEAD W/O CM
10 of 11 series · 43 of 48 positions shown · non-contrast
Comparison: Same day CT head.

CLINICAL DATA: Neuro deficit, acute stroke suspected.

EXAM:
MRI HEAD WITHOUT CONTRAST
TECHNIQUE: Multiplanar, multiecho pulse sequences of the brain and surrounding
structures were obtained without intravenous contrast.

[Series 5: DWI · axial · 3.0mm · 0.88mm/px · z∈[-115,+31]mm · 10 of 100 slices shown (1 of 4)]
[im 1/100]
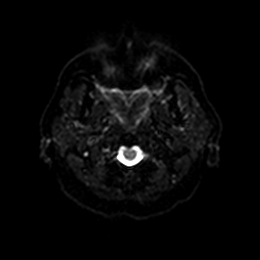
[im 12/100]
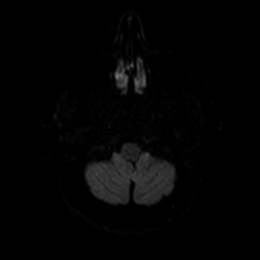
[im 23/100]
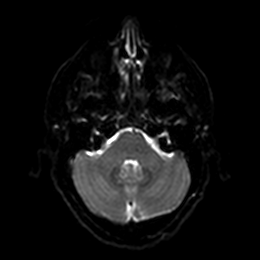
[im 34/100]
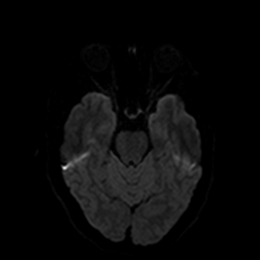
[im 45/100]
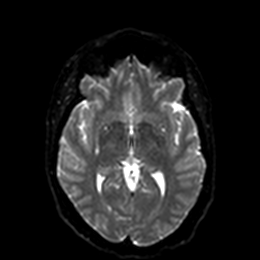
[im 56/100]
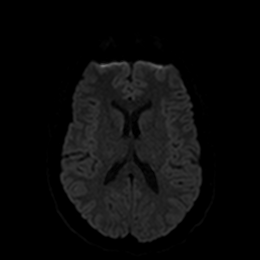
[im 67/100]
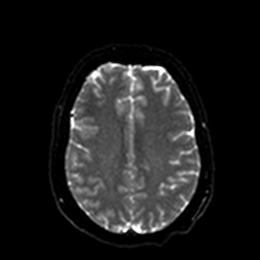
[im 78/100]
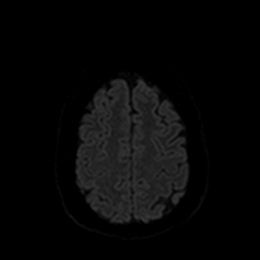
[im 89/100]
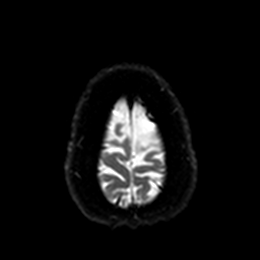
[im 100/100]
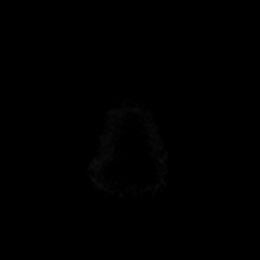

[Series 6: DWI · axial · 3.0mm · 0.88mm/px · z∈[-115,+31]mm · 4 of 50 slices shown (2 of 4)]
[im 1/50]
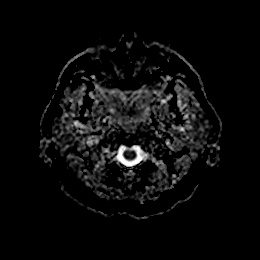
[im 17/50]
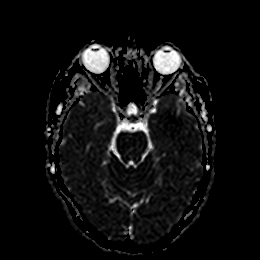
[im 33/50]
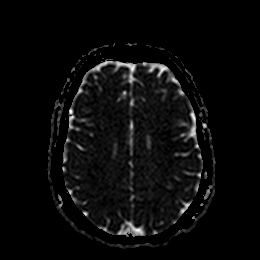
[im 50/50]
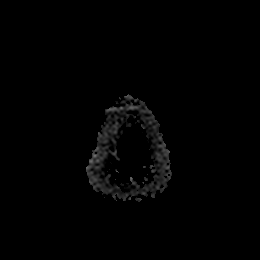

[Series 7: DWI · coronal · 4.0mm · 0.88mm/px · 7 of 76 slices shown (3 of 4)]
[im 1/76]
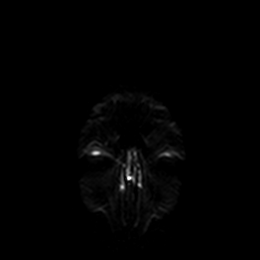
[im 13/76]
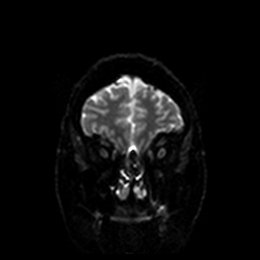
[im 26/76]
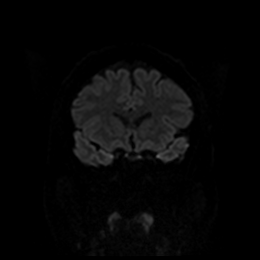
[im 38/76]
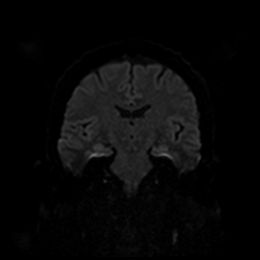
[im 51/76]
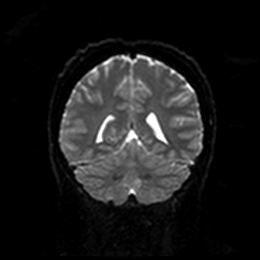
[im 63/76]
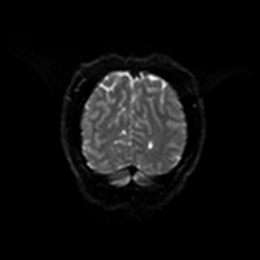
[im 76/76]
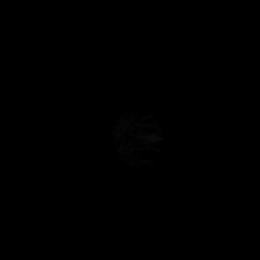

[Series 8: DWI · coronal · 4.0mm · 0.88mm/px · 3 of 38 slices shown (4 of 4)]
[im 1/38]
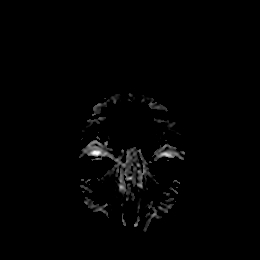
[im 19/38]
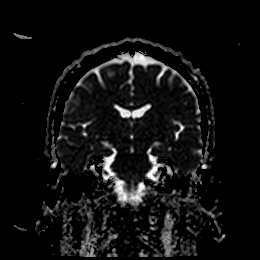
[im 38/38]
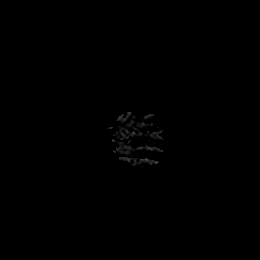

[Series 9: FLAIR · axial · 5.0mm · 0.45mm/px · z∈[-111,+32]mm · 2 of 25 slices shown]
[im 1/25]
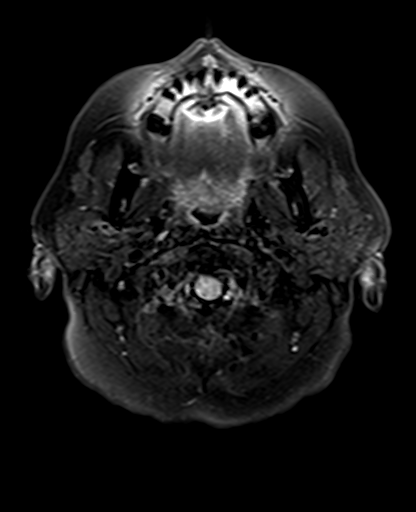
[im 25/25]
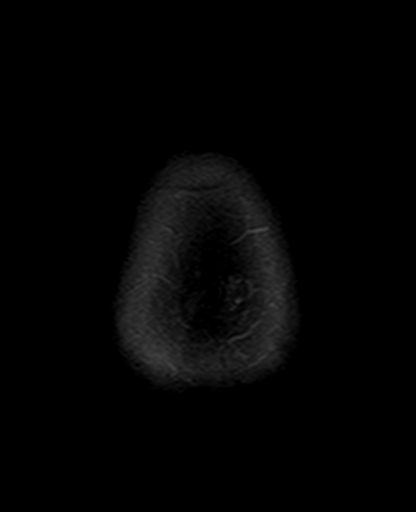

[Series 11: pha_images · axial · 3.0mm · 0.90mm/px · z∈[-122,+42]mm · 5 of 56 slices shown]
[im 1/56]
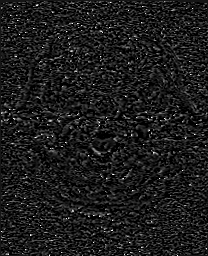
[im 14/56]
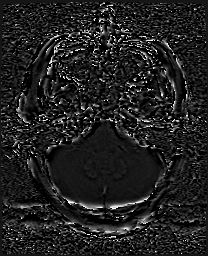
[im 28/56]
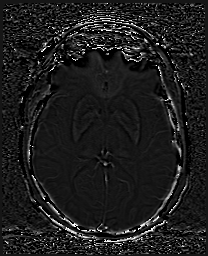
[im 42/56]
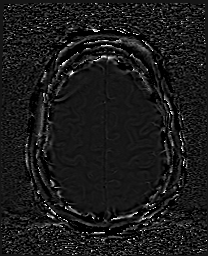
[im 56/56]
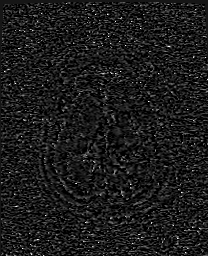

[Series 12: swi_images · axial · 3.0mm · 0.90mm/px · z∈[-122,+42]mm · 5 of 56 slices shown]
[im 1/56]
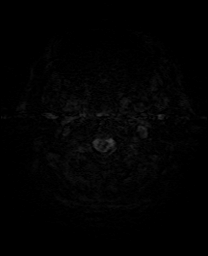
[im 14/56]
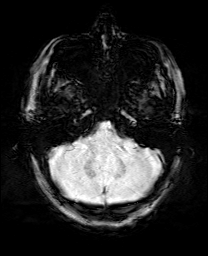
[im 28/56]
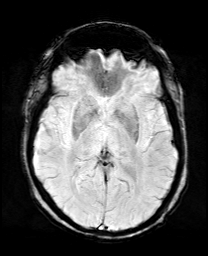
[im 42/56]
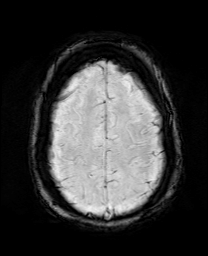
[im 56/56]
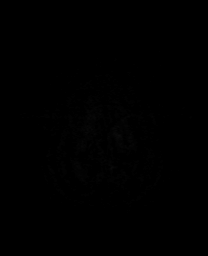

[Series 14: T1 · sagittal · 5.0mm · 0.75mm/px · 2 of 25 slices shown]
[im 1/25]
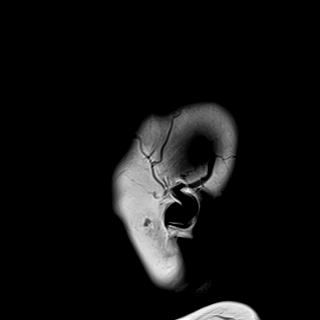
[im 25/25]
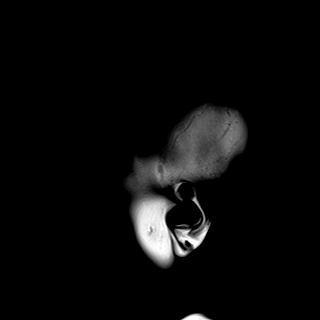

[Series 15: T2 · axial · 5.0mm · 0.72mm/px · z∈[-112,+32]mm · 2 of 25 slices shown (1 of 2)]
[im 1/25]
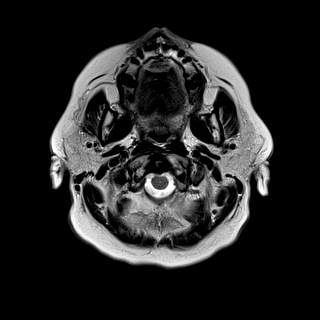
[im 25/25]
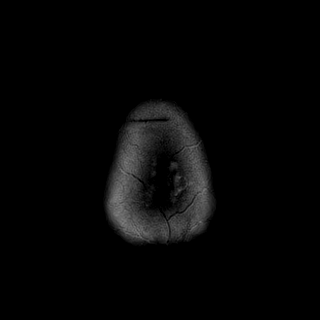

[Series 17: T2 · coronal · 5.0mm · 0.34mm/px · 3 of 31 slices shown (2 of 2)]
[im 1/31]
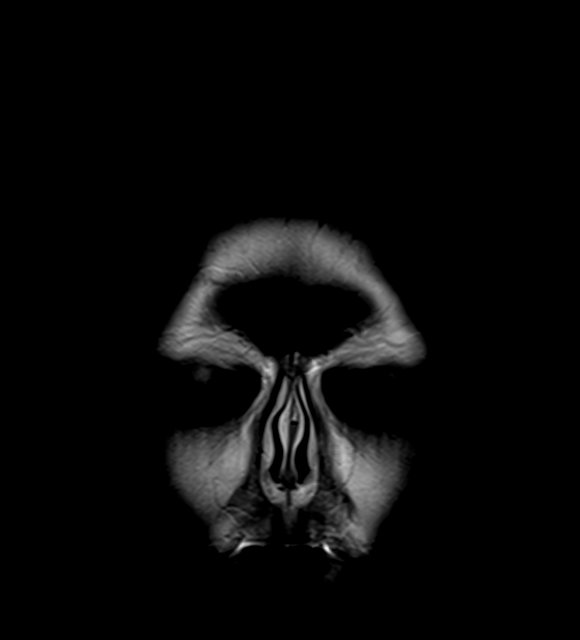
[im 16/31]
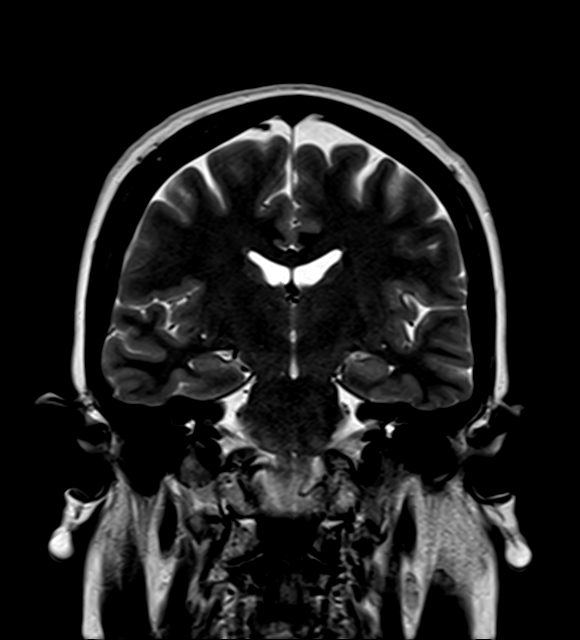
[im 31/31]
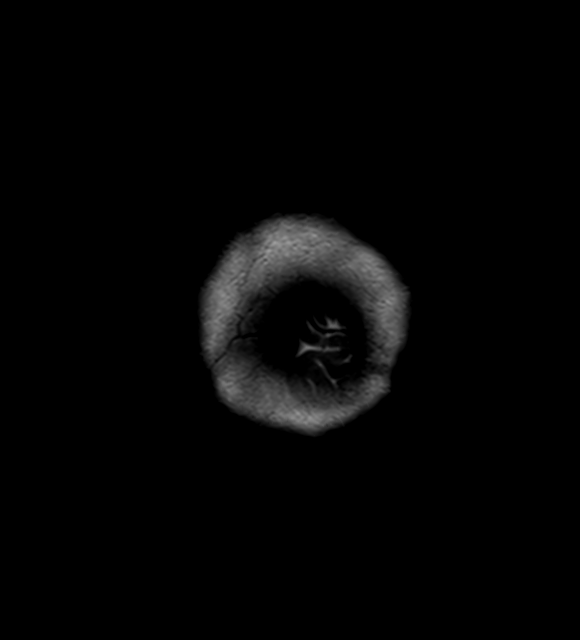

[43 of 48 positions shown; findings below may reference images not displayed]

FINDINGS: Brain: No acute infarction, hemorrhage, hydrocephalus, extra-axial
collection or mass lesion. Punctate focus of susceptibility artifact
in the left cerebellum without edema, most likely the sequela of
prior microhemorrhage. Also, susceptibility artifact in the left
periventricular frontal lobe without edema, nonspecific but likely
also from prior hemorrhage. Both these areas are hyperintense on
phase imaging without correlate hyperdensity on CT head, favoring
areas of prior hemorrhage.

Vascular: Major arterial flow voids are maintained at the skull
base.

Skull and upper cervical spine: Normal marrow signal.

Sinuses/Orbits: Clear sinuses.  Unremarkable orbits.

Other: No mastoid effusions.
IMPRESSION: 1. No evidence of acute intracranial abnormality. Specifically, no
acute infarct.
2. Suspected prior microhemorrhage in the left cerebellum and prior
hemorrhage in the left periventricular frontal lobe, as detailed
above.

## 2023-09-19 ENCOUNTER — Other Ambulatory Visit (HOSPITAL_BASED_OUTPATIENT_CLINIC_OR_DEPARTMENT_OTHER): Payer: Self-pay | Admitting: Family Medicine

## 2023-09-19 DIAGNOSIS — E041 Nontoxic single thyroid nodule: Secondary | ICD-10-CM

## 2023-09-24 ENCOUNTER — Ambulatory Visit (INDEPENDENT_AMBULATORY_CARE_PROVIDER_SITE_OTHER)
Admission: RE | Admit: 2023-09-24 | Discharge: 2023-09-24 | Disposition: A | Discharge status: Home / Self Care | Source: Ambulatory Visit | Attending: Family Medicine | Admitting: Family Medicine

## 2023-09-24 DIAGNOSIS — E041 Nontoxic single thyroid nodule: Secondary | ICD-10-CM | POA: Diagnosis not present

## 2024-01-21 DIAGNOSIS — Z01818 Encounter for other preprocedural examination: Secondary | ICD-10-CM | POA: Diagnosis not present
